# Patient Record
Sex: Female | Born: 1985 | Hispanic: Yes | Marital: Single | Smoking: Never smoker
Health system: Southern US, Community
[De-identification: ages and names within clinical notes are randomized; demographics above are authoritative.]

## PROBLEM LIST (undated history)

## (undated) DIAGNOSIS — K219 Gastro-esophageal reflux disease without esophagitis: Secondary | ICD-10-CM

## (undated) HISTORY — DX: Gastro-esophageal reflux disease without esophagitis: K21.9

---

## 2014-07-14 ENCOUNTER — Inpatient Hospital Stay (HOSPITAL_COMMUNITY)
Admission: EM | Admit: 2014-07-14 | Discharge: 2014-07-17 | DRG: 419 | Disposition: A | Discharge status: Home / Self Care | Payer: Self-pay | Attending: Surgery | Admitting: Surgery

## 2014-07-14 ENCOUNTER — Emergency Department (HOSPITAL_COMMUNITY): Payer: Self-pay

## 2014-07-14 ENCOUNTER — Encounter (HOSPITAL_COMMUNITY): Payer: Self-pay | Admitting: General Surgery

## 2014-07-14 DIAGNOSIS — K8042 Calculus of bile duct with acute cholecystitis without obstruction: Secondary | ICD-10-CM

## 2014-07-14 DIAGNOSIS — K8010 Calculus of gallbladder with chronic cholecystitis without obstruction: Secondary | ICD-10-CM | POA: Diagnosis present

## 2014-07-14 DIAGNOSIS — Z419 Encounter for procedure for purposes other than remedying health state, unspecified: Secondary | ICD-10-CM

## 2014-07-14 DIAGNOSIS — K8309 Other cholangitis: Secondary | ICD-10-CM | POA: Diagnosis present

## 2014-07-14 DIAGNOSIS — K8050 Calculus of bile duct without cholangitis or cholecystitis without obstruction: Secondary | ICD-10-CM | POA: Diagnosis present

## 2014-07-14 DIAGNOSIS — K8063 Calculus of gallbladder and bile duct with acute cholecystitis with obstruction: Principal | ICD-10-CM | POA: Diagnosis present

## 2014-07-14 LAB — COMPREHENSIVE METABOLIC PANEL
ALK PHOS: 375 U/L — AB (ref 38–126)
ALT: 706 U/L — AB (ref 14–54)
ANION GAP: 11 (ref 5–15)
AST: 430 U/L — ABNORMAL HIGH (ref 15–41)
Albumin: 4 g/dL (ref 3.5–5.0)
CO2: 27 mmol/L (ref 22–32)
Calcium: 9.3 mg/dL (ref 8.9–10.3)
Chloride: 97 mmol/L — ABNORMAL LOW (ref 101–111)
Creatinine, Ser: 0.57 mg/dL (ref 0.44–1.00)
GFR calc Af Amer: >60 mL/min (ref >60)
GFR calc non Af Amer: >60 mL/min (ref >60)
Glucose, Bld: 98 mg/dL (ref 65–99)
POTASSIUM: 3.5 mmol/L (ref 3.5–5.1)
SODIUM: 135 mmol/L (ref 135–145)
TOTAL PROTEIN: 8.6 g/dL — AB (ref 6.5–8.1)
Total Bilirubin: 8.3 mg/dL — ABNORMAL HIGH (ref 0.3–1.2)

## 2014-07-14 LAB — CBC WITH DIFFERENTIAL/PLATELET
BASOS ABS: 0 10*3/uL (ref 0.0–0.1)
BASOS PCT: 0 % (ref 0–1)
EOS ABS: 0 10*3/uL (ref 0.0–0.7)
Eosinophils Relative: 0 % (ref 0–5)
HEMATOCRIT: 36 % (ref 36.0–46.0)
HEMOGLOBIN: 12 g/dL (ref 12.0–15.0)
LYMPHS ABS: 1.3 10*3/uL (ref 0.7–4.0)
LYMPHS PCT: 13 % (ref 12–46)
MCH: 26 pg (ref 26.0–34.0)
MCHC: 33.3 g/dL (ref 30.0–36.0)
MCV: 77.9 fL — ABNORMAL LOW (ref 78.0–100.0)
Monocytes Absolute: 1.4 10*3/uL — ABNORMAL HIGH (ref 0.1–1.0)
Monocytes Relative: 13 % — ABNORMAL HIGH (ref 3–12)
NEUTROS ABS: 7.8 10*3/uL — AB (ref 1.7–7.7)
Neutrophils Relative %: 74 % (ref 43–77)
PLATELETS: 247 10*3/uL (ref 150–400)
RBC: 4.62 MIL/uL (ref 3.87–5.11)
RDW: 14.4 % (ref 11.5–15.5)
WBC: 10.5 10*3/uL (ref 4.0–10.5)

## 2014-07-14 LAB — URINALYSIS, ROUTINE W REFLEX MICROSCOPIC
Glucose, UA: NEGATIVE mg/dL
Ketones, ur: >80 mg/dL — AB
NITRITE: POSITIVE — AB
PH: 6 (ref 5.0–8.0)
PROTEIN: 30 mg/dL — AB
SPECIFIC GRAVITY, URINE: 1.025 (ref 1.005–1.030)
Urobilinogen, UA: 1 mg/dL (ref 0.0–1.0)

## 2014-07-14 LAB — URINE MICROSCOPIC-ADD ON

## 2014-07-14 LAB — PREGNANCY, URINE: Preg Test, Ur: NEGATIVE

## 2014-07-14 LAB — LIPASE, BLOOD: LIPASE: 20 U/L — AB (ref 22–51)

## 2014-07-14 MED ORDER — CEFTRIAXONE SODIUM IN DEXTROSE 40 MG/ML IV SOLN
2.0000 g | INTRAVENOUS | Status: DC
  Administered 2014-07-14 – 2014-07-16 (×3): 2 g via INTRAVENOUS
  Filled 2014-07-14 (×5): qty 50

## 2014-07-14 MED ORDER — CETYLPYRIDINIUM CHLORIDE 0.05 % MT LIQD
7.0000 mL | Freq: Two times a day (BID) | OROMUCOSAL | Status: DC
  Administered 2014-07-14 – 2014-07-15 (×3): 7 mL via OROMUCOSAL

## 2014-07-14 MED ORDER — ACETAMINOPHEN 325 MG PO TABS
650.0000 mg | ORAL_TABLET | Freq: Four times a day (QID) | ORAL | Status: DC | PRN
  Administered 2014-07-15 – 2014-07-16 (×2): 650 mg via ORAL
  Filled 2014-07-14 (×2): qty 2

## 2014-07-14 MED ORDER — ONDANSETRON HCL 4 MG/2ML IJ SOLN
4.0000 mg | Freq: Once | INTRAMUSCULAR | Status: AC
  Administered 2014-07-14: 4 mg via INTRAVENOUS
  Filled 2014-07-14: qty 2

## 2014-07-14 MED ORDER — ONDANSETRON HCL 4 MG/2ML IJ SOLN
4.0000 mg | Freq: Four times a day (QID) | INTRAMUSCULAR | Status: DC | PRN
  Administered 2014-07-15 – 2014-07-16 (×2): 4 mg via INTRAVENOUS
  Filled 2014-07-14 (×2): qty 2

## 2014-07-14 MED ORDER — DIPHENHYDRAMINE HCL 12.5 MG/5ML PO ELIX: 12.5000 mg | ORAL_SOLUTION | Freq: Four times a day (QID) | ORAL | Status: DC | PRN

## 2014-07-14 MED ORDER — ACETAMINOPHEN 650 MG RE SUPP: 650.0000 mg | Freq: Four times a day (QID) | RECTAL | Status: DC | PRN

## 2014-07-14 MED ORDER — SODIUM CHLORIDE 0.9 % IV BOLUS (SEPSIS)
500.0000 mL | Freq: Once | INTRAVENOUS | Status: AC
  Administered 2014-07-14: 500 mL via INTRAVENOUS

## 2014-07-14 MED ORDER — MORPHINE SULFATE 2 MG/ML IJ SOLN
1.0000 mg | INTRAMUSCULAR | Status: DC | PRN
  Administered 2014-07-14 – 2014-07-15 (×3): 4 mg via INTRAVENOUS
  Administered 2014-07-15: 2 mg via INTRAVENOUS
  Administered 2014-07-15: 4 mg via INTRAVENOUS
  Administered 2014-07-16 (×4): 2 mg via INTRAVENOUS
  Administered 2014-07-16: 4 mg via INTRAVENOUS
  Administered 2014-07-17: 2 mg via INTRAVENOUS
  Administered 2014-07-17 (×2): 4 mg via INTRAVENOUS
  Filled 2014-07-14 (×2): qty 1
  Filled 2014-07-14: qty 2
  Filled 2014-07-14: qty 1
  Filled 2014-07-14: qty 2
  Filled 2014-07-14: qty 1
  Filled 2014-07-14 (×4): qty 2
  Filled 2014-07-14: qty 1
  Filled 2014-07-14: qty 2
  Filled 2014-07-14: qty 1

## 2014-07-14 MED ORDER — PANTOPRAZOLE SODIUM 40 MG PO TBEC
40.0000 mg | DELAYED_RELEASE_TABLET | Freq: Every day | ORAL | Status: DC
  Administered 2014-07-14 – 2014-07-17 (×3): 40 mg via ORAL
  Filled 2014-07-14 (×3): qty 1

## 2014-07-14 MED ORDER — MORPHINE SULFATE 2 MG/ML IJ SOLN
2.0000 mg | Freq: Once | INTRAMUSCULAR | Status: AC
  Administered 2014-07-14: 2 mg via INTRAVENOUS
  Filled 2014-07-14: qty 1

## 2014-07-14 MED ORDER — KCL IN DEXTROSE-NACL 20-5-0.45 MEQ/L-%-% IV SOLN
INTRAVENOUS | Status: DC
  Administered 2014-07-14 – 2014-07-17 (×8): via INTRAVENOUS
  Filled 2014-07-14 (×9): qty 1000

## 2014-07-14 MED ORDER — CHLORHEXIDINE GLUCONATE 0.12 % MT SOLN
15.0000 mL | Freq: Two times a day (BID) | OROMUCOSAL | Status: DC
  Administered 2014-07-14 – 2014-07-17 (×3): 15 mL via OROMUCOSAL
  Filled 2014-07-14 (×4): qty 15

## 2014-07-14 MED ORDER — DIPHENHYDRAMINE HCL 50 MG/ML IJ SOLN
12.5000 mg | Freq: Four times a day (QID) | INTRAMUSCULAR | Status: DC | PRN
Start: 2014-07-14 — End: 2014-07-17

## 2014-07-14 NOTE — Progress Notes (Signed)
Pt states her pain is better now and she appears to be resting comfortably.  ERCP has been arranged for 7:30 tomorrow by Dr. Ewing SchleinMagod.  Florencia Reasonsobert V. Devora Tortorella, M.D. Pager [phone] If no answer or after 5 PM call [phone]

## 2014-07-14 NOTE — H&P (Signed)
Debra Blackburn 04-29-1985  [id number].   Primary Care MD: none Chief Complaint/Reason for Consult: choledocholithiasis HPI: This is a pleasant 29 yo Hispanic female who speaks minimal English, began having epigastric abdominal pain on Tuesday.  She has had some nausea and vomiting.  She has barely eaten since then as it makes her nausea worse.  She had diarrhea on Tuesday, but none since then.  She denies fevers.  She states yesterday her urine began to darken.  She denies skin color or icteric changes.  She presented to [facility] today for further evaluation due to pain.  She had an [place] that revealed cholelithiasis with possible wall thickening as well as a 69m CBD with a distal calculus causing an obstruction.  Her TB is 8.3 and her other LFTs are elevated as well.  Her WBC is normal with no left shift.  We have been asked to see her.  ROS : Please see HPI, otherwise negative.  She denies any history of hepatitis.  History reviewed. No pertinent family history.  History reviewed. No pertinent past medical history.  Past Surgical History  Procedure Laterality Date  . Cesarean section      x3    Social History:  reports that she has never smoked. She does not have any smokeless tobacco history on file. She reports that she does not drink alcohol or use illicit drugs.  Allergies: No Known Allergies   (Not in a hospital admission)  Blood pressure 126/74, pulse 80, temperature 98.3 F (36.8 C), temperature source Oral, resp. rate 18, weight 65.772 kg (145 lb), SpO2 100 %. Physical Exam: General: pleasant, WD, WN Hispanic female who is laying in bed in NAD HEENT: head is normocephalic, atraumatic.  Sclera are noninjected, but icteric.  PERRL.  Ears and nose without any masses or lesions.  Mouth is pink and moist Heart: regular, rate, and rhythm.  Normal s1,s2. No obvious murmurs, gallops, or rubs noted.  Palpable radial and pedal pulses bilaterally Lungs: CTAB, no wheezes, rhonchi, or  rales noted.  Respiratory effort nonlabored Abd: soft, tender in RUQ and greatest in epigastrium, ND, +BS, no masses, hernias, or organomegaly MS: all 4 extremities are symmetrical with no cyanosis, clubbing, or edema. Skin: warm and dry with no masses, lesions, or rashes, but is jaundiced Psych: A&Ox3 with an appropriate affect.    Results for orders placed or performed during the hospital encounter of 07/14/14 (from the past 48 hour(s))  Comprehensive metabolic panel     Status: Abnormal   Collection Time: 07/14/14  9:01 AM  Result Value Ref Range   Sodium 135 135 - 145 mmol/L   Potassium 3.5 3.5 - 5.1 mmol/L   Chloride 97 (L) 101 - 111 mmol/L   CO2 27 22 - 32 mmol/L   Glucose, Bld 98 65 - 99 mg/dL   BUN <5 (L) 6 - 20 mg/dL   Creatinine, Ser 0.57 0.44 - 1.00 mg/dL   Calcium 9.3 8.9 - 10.3 mg/dL   Total Protein 8.6 (H) 6.5 - 8.1 g/dL   Albumin 4.0 3.5 - 5.0 g/dL   AST 430 (H) 15 - 41 U/L   ALT 706 (H) 14 - 54 U/L   Alkaline Phosphatase 375 (H) 38 - 126 U/L   Total Bilirubin 8.3 (H) 0.3 - 1.2 mg/dL   GFR calc non Af Amer >60 >60 mL/min   GFR calc Af Amer >60 >60 mL/min    Comment: (NOTE) The eGFR has been calculated using the CKD EPI equation.  This calculation has not been validated in all clinical situations. eGFR's persistently <60 mL/min signify possible Chronic Kidney Disease.    Anion gap 11 5 - 15  Lipase, blood     Status: Abnormal   Collection Time: 07/14/14  9:01 AM  Result Value Ref Range   Lipase 20 (L) 22 - 51 U/L  CBC with Differential     Status: Abnormal   Collection Time: 07/14/14  9:01 AM  Result Value Ref Range   WBC 10.5 4.0 - 10.5 K/uL   RBC 4.62 3.87 - 5.11 MIL/uL   Hemoglobin 12.0 12.0 - 15.0 g/dL   HCT 36.0 36.0 - 46.0 %   MCV 77.9 (L) 78.0 - 100.0 fL   MCH 26.0 26.0 - 34.0 pg   MCHC 33.3 30.0 - 36.0 g/dL   RDW 14.4 11.5 - 15.5 %   Platelets 247 150 - 400 K/uL   Neutrophils Relative % 74 43 - 77 %   Neutro Abs 7.8 (H) 1.7 - 7.7 K/uL    Lymphocytes Relative 13 12 - 46 %   Lymphs Abs 1.3 0.7 - 4.0 K/uL   Monocytes Relative 13 (H) 3 - 12 %   Monocytes Absolute 1.4 (H) 0.1 - 1.0 K/uL   Eosinophils Relative 0 0 - 5 %   Eosinophils Absolute 0.0 0.0 - 0.7 K/uL   Basophils Relative 0 0 - 1 %   Basophils Absolute 0.0 0.0 - 0.1 K/uL  Urinalysis, Routine w reflex microscopic (not at [facility])     Status: Abnormal   Collection Time: 07/14/14 10:42 AM  Result Value Ref Range   Color, Urine AMBER (A) YELLOW    Comment: BIOCHEMICALS MAY BE AFFECTED BY COLOR   APPearance CLEAR CLEAR   Specific Gravity, Urine 1.025 1.005 - 1.030   pH 6.0 5.0 - 8.0   Glucose, UA NEGATIVE NEGATIVE mg/dL   Hgb urine dipstick TRACE (A) NEGATIVE   Bilirubin Urine LARGE (A) NEGATIVE   Ketones, ur >80 (A) NEGATIVE mg/dL   Protein, ur 30 (A) NEGATIVE mg/dL   Urobilinogen, UA 1.0 0.0 - 1.0 mg/dL   Nitrite POSITIVE (A) NEGATIVE   Leukocytes, UA TRACE (A) NEGATIVE  Pregnancy, urine     Status: None   Collection Time: 07/14/14 10:42 AM  Result Value Ref Range   Preg Test, Ur NEGATIVE NEGATIVE    Comment:        THE SENSITIVITY OF THIS METHODOLOGY IS >20 mIU/mL.   Urine microscopic-add on     Status: Abnormal   Collection Time: 07/14/14 10:42 AM  Result Value Ref Range   Squamous Epithelial / LPF FEW (A) RARE   WBC, UA 0-2 <3 WBC/hpf   Bacteria, UA FEW (A) RARE   Urine-Other MUCOUS PRESENT    US Abdomen Complete  07/14/2014   CLINICAL DATA:  Three-day history abdominal pain  EXAM: ULTRASOUND ABDOMEN COMPLETE  COMPARISON:  None.  FINDINGS: Gallbladder: Gallbladder is filled with calculi. The gallbladder wall appears thickened and somewhat contracted. There is no pericholecystic fluid. Patient is focally tender over the gallbladder.  Common bile duct: Diameter: 9 mm. There is a calculus in the distal common bile duct causing obstruction.  Liver: No focal lesion identified. Note that there is intrahepatic biliary duct dilatation. Within normal limits in  parenchymal echogenicity.  IVC: No abnormality visualized.  Pancreas: Visualized portion unremarkable. Portions of the tail of the pancreas are obscured by gas.  Spleen: Size and appearance within normal limits.  Right Kidney: Length:  10.9 cm. Echogenicity within normal limits. No mass or hydronephrosis visualized.  Left Kidney: Length: 11.9 cm. Echogenicity within normal limits. No mass or hydronephrosis visualized.  Abdominal aorta: No aneurysm visualized.  Other findings: No demonstrable ascites.  IMPRESSION: Gallbladder filled with calculi. Gallbladder wall is thickened. The patient is focally tender over the gallbladder. These are findings consistent with acute cholecystitis. There is also a calculus in the distal common bile duct causing biliary duct obstruction. There is intrahepatic and extrahepatic biliary duct dilatation.  Portions of the tail of the pancreas are obscured by gas. Visualized portions of pancreas appear normal.  Study otherwise unremarkable.   Electronically Signed   By: Lowella Grip III M.D.   On: 07/14/2014 10:19       Assessment/Plan 1. Choledocholithiasis with elevated TB at 8.3. -admit, IVFs, rocephin -NPO -GI consult for ERCP -will plan on lap chole after ERCP -repeat labs in am -SCDs.  Hold chemical prophylaxis right now due to pending procedures -this was all d/w patient via an interpreter.  She understands and is agreeable to proceed.  Adelis Docter E 07/14/2014, 11:39 AM Pager: [phone]

## 2014-07-14 NOTE — ED Notes (Signed)
Belly pain since Tuesday; vomiting after eating; 4 episodes of diarr.; constant. Pt reports she is not pregnant. Pt reports this has never happened to her before.

## 2014-07-14 NOTE — ED Provider Notes (Signed)
CSN: [id number]     Arrival date & time 07/14/14  0820 History   First MD Initiated Contact with Patient 07/14/14 0827     Chief Complaint  Patient presents with  . Abdominal Pain    Patient speaks Spanish and was translated using the translator phones (Consider location/radiation/quality/duration/timing/severity/associated sxs/prior Treatment) Patient is a 29 y.o. female presenting with abdominal pain. The history is provided by the patient.  Abdominal Pain Associated symptoms: diarrhea, nausea and vomiting   Associated symptoms: no chest pain and no shortness of breath    patient resents with abdominal pain has been going for the last 5 days. It is in the upper abdomen on the right side worse with eating. No fevers. Has had a little diarrhea. Decreased appetite. The pain is dull. It does radiate to the back. It started in this location and is not changed. She denies dysuria. She denies possibility of pregnancy.  No past medical history on file. No past surgical history on file. No family history on file. History  Substance Use Topics  . Smoking status: Not on file  . Smokeless tobacco: Not on file  . Alcohol Use: Not on file   OB History    No data available     Review of Systems  Constitutional: Positive for appetite change. Negative for activity change.  Eyes: Negative for pain.  Respiratory: Negative for chest tightness and shortness of breath.   Cardiovascular: Negative for chest pain and leg swelling.  Gastrointestinal: Positive for nausea, vomiting, abdominal pain and diarrhea.  Genitourinary: Negative for flank pain.  Musculoskeletal: Positive for back pain. Negative for neck stiffness.  Skin: Negative for rash.  Neurological: Negative for weakness, numbness and headaches.      Allergies  Review of patient's allergies indicates no known allergies.  Home Medications   Prior to Admission medications   Not on File   BP 126/74 mmHg  Pulse 80  Temp(Src) 98.3  F (36.8 C) (Oral)  Resp 18  Wt 145 lb (65.772 kg)  SpO2 100% Physical Exam  Constitutional: She appears well-developed and well-nourished.  HENT:  Head: Normocephalic.  Eyes: Pupils are equal, round, and reactive to light.  Cardiovascular: Normal rate.   Pulmonary/Chest: Effort normal.  Abdominal: There is tenderness. There is no rebound and no guarding.  Right upper quadrant tenderness. No masses. No hernias.  Genitourinary:  No CVA tenderness  Musculoskeletal: Normal range of motion.  Neurological: She is alert.    ED Course  Procedures (including critical care time) Labs Review Labs Reviewed  COMPREHENSIVE METABOLIC PANEL - Abnormal; Notable for the following:    Chloride 97 (*)    BUN <5 (*)    Total Protein 8.6 (*)    AST 430 (*)    ALT 706 (*)    Alkaline Phosphatase 375 (*)    Total Bilirubin 8.3 (*)    All other components within normal limits  LIPASE, BLOOD - Abnormal; Notable for the following:    Lipase 20 (*)    All other components within normal limits  CBC WITH DIFFERENTIAL/PLATELET - Abnormal; Notable for the following:    MCV 77.9 (*)    Neutro Abs 7.8 (*)    Monocytes Relative 13 (*)    Monocytes Absolute 1.4 (*)    All other components within normal limits  PREGNANCY, URINE  URINALYSIS, ROUTINE W REFLEX MICROSCOPIC (NOT AT [facility])    Imaging Review US Abdomen Complete  07/14/2014   CLINICAL DATA:  Three-day history abdominal  pain  EXAM: ULTRASOUND ABDOMEN COMPLETE  COMPARISON:  None.  FINDINGS: Gallbladder: Gallbladder is filled with calculi. The gallbladder wall appears thickened and somewhat contracted. There is no pericholecystic fluid. Patient is focally tender over the gallbladder.  Common bile duct: Diameter: 9 mm. There is a calculus in the distal common bile duct causing obstruction.  Liver: No focal lesion identified. Note that there is intrahepatic biliary duct dilatation. Within normal limits in parenchymal echogenicity.  IVC: No abnormality  visualized.  Pancreas: Visualized portion unremarkable. Portions of the tail of the pancreas are obscured by gas.  Spleen: Size and appearance within normal limits.  Right Kidney: Length: 10.9 cm. Echogenicity within normal limits. No mass or hydronephrosis visualized.  Left Kidney: Length: 11.9 cm. Echogenicity within normal limits. No mass or hydronephrosis visualized.  Abdominal aorta: No aneurysm visualized.  Other findings: No demonstrable ascites.  IMPRESSION: Gallbladder filled with calculi. Gallbladder wall is thickened. The patient is focally tender over the gallbladder. These are findings consistent with acute cholecystitis. There is also a calculus in the distal common bile duct causing biliary duct obstruction. There is intrahepatic and extrahepatic biliary duct dilatation.  Portions of the tail of the pancreas are obscured by gas. Visualized portions of pancreas appear normal.  Study otherwise unremarkable.   Electronically Signed   By: Bretta BangWilliam  Woodruff III M.D.   On: 07/14/2014 10:19     EKG Interpretation None      MDM   Final diagnoses:  Choledocholithiasis with acute cholecystitis    Patient with abdominal pain. Has choledocholithiasis with cholecystitis. Are not heard about to be admitted by general surgery.    Benjiman CoreNathan Masayo Fera, MD 07/14/14 [phone]

## 2014-07-14 NOTE — Consult Note (Signed)
Referring Provider: [facility] surgery Primary Care Physician:  No primary care provider on file. Primary Gastroenterologist:  None (unassigned)  Reason for Consultation:  Common bile duct stone  HPI: Debra Blackburn is a 29 y.o. female being admitted through the emergency room today with a 2 day history of significant abdominal pain, nausea, and vomiting with significant elevation of liver chemistries (bilirubin 8, transaminases in the 500 range) with documented choledocholithiasis and cholelithiasis on abdominal ultrasound, with a 9 mm CBD.   No fevers or chills.   She is otherwise in good health.   At this time, she remains in pain, which has been continuously present for the past 2 days. However, I did order 2 mg of morphine in the emergency room which was administered and she is now somewhat better and appears somewhat more comfortable.   The patient just flew up from [place] about a week or 2 ago. She speaks very limited [place]. She is joining her mother, sisters, and her boyfriend is coming.   History reviewed. No pertinent past medical history.  Past Surgical History  Procedure Laterality Date  . Cesarean section      x3    Prior to Admission medications   Not on File    No current facility-administered medications for this encounter.   No current outpatient prescriptions on file.    Allergies as of 07/14/2014  . (No Known Allergies)    History reviewed. No pertinent family history.  History   Social History  . Marital Status: Single    Spouse Name: N/A  . Number of Children: N/A  . Years of Education: N/A   Occupational History  . Not on file.   Social History Main Topics  . Smoking status: Never Smoker   . Smokeless tobacco: Not on file  . Alcohol Use: No  . Drug Use: No  . Sexual Activity: Not on file   Other Topics Concern  . Not on file   Social History Narrative  . No narrative on file    Review of Systems: See history of  present illness. She has had brown urine  Physical Exam: Vital signs in last 24 hours: Temp:  [98.3 F (36.8 C)] 98.3 F (36.8 C) (06/30 0832) Pulse Rate:  [75-102] 92 (06/30 1330) Resp:  [16-18] 18 (06/30 0900) BP: (112-140)/(57-82) 140/82 mmHg (06/30 1330) SpO2:  [97 %-100 %] 100 % (06/30 1330) Weight:  [65.772 kg (145 lb)] 65.772 kg (145 lb) (06/30 0833)   General:   Alert, overall healthy in appearance, Well-developed, well-nourished, pleasant and cooperative in mild to moderate distress because of abdominal pain. Head:  Normocephalic and atraumatic. Eyes:  Sclera anicteric.  Conjunctiva pink. Mouth:   No ulcerations or lesions.  Oropharynx pink & moist. Neck:   No masses or thyromegaly. Lungs:  Clear throughout to auscultation.   No wheezes, crackles, or rhonchi. No evident respiratory distress. Heart:   Regular rate and rhythm; no murmurs, clicks, rubs,  or gallops. Abdomen:  Soft, nontympanitic, and nondistended. No masses, hepatosplenomegaly or ventral hernias noted. Normal bowel sounds, without bruits.. There is mild to moderate right upper quadrant tenderness. Msk:   Symmetrical without gross deformities. Pulses:  Normal radial pulse is noted. Extremities:   Without clubbing, cyanosis, or edema. Neurologic:  Alert and coherent;  grossly normal neurologically. Skin:  Intact without significant lesions or rashes. Cervical Nodes:  No significant cervical adenopathy. Psych:   Alert and cooperative. Normal mood and affect.  Intake/Output from previous day:  Intake/Output this shift: Total I/O In: 500 [I.V.:500] Out: -   Lab Results:  Recent Labs  07/14/14 0901  WBC 10.5  HGB 12.0  HCT 36.0  PLT 247   BMET  Recent Labs  07/14/14 0901  NA 135  K 3.5  CL 97*  CO2 27  GLUCOSE 98  BUN <5*  CREATININE 0.57  CALCIUM 9.3   LFT  Recent Labs  07/14/14 0901  PROT 8.6*  ALBUMIN 4.0  AST 430*  ALT 706*  ALKPHOS 375*  BILITOT 8.3*   PT/INR No results  for input(s): LABPROT, INR in the last 72 hours.  Studies/Results: [place] Abdomen Complete  07/14/2014   CLINICAL DATA:  Three-day history abdominal pain  EXAM: ULTRASOUND ABDOMEN COMPLETE  COMPARISON:  None.  FINDINGS: Gallbladder: Gallbladder is filled with calculi. The gallbladder wall appears thickened and somewhat contracted. There is no pericholecystic fluid. Patient is focally tender over the gallbladder.  Common bile duct: Diameter: 9 mm. There is a calculus in the distal common bile duct causing obstruction.  Liver: No focal lesion identified. Note that there is intrahepatic biliary duct dilatation. Within normal limits in parenchymal echogenicity.  IVC: No abnormality visualized.  Pancreas: Visualized portion unremarkable. Portions of the tail of the pancreas are obscured by gas.  Spleen: Size and appearance within normal limits.  Right Kidney: Length: 10.9 cm. Echogenicity within normal limits. No mass or hydronephrosis visualized.  Left Kidney: Length: 11.9 cm. Echogenicity within normal limits. No mass or hydronephrosis visualized.  Abdominal aorta: No aneurysm visualized.  Other findings: No demonstrable ascites.  IMPRESSION: Gallbladder filled with calculi. Gallbladder wall is thickened. The patient is focally tender over the gallbladder. These are findings consistent with acute cholecystitis. There is also a calculus in the distal common bile duct causing biliary duct obstruction. There is intrahepatic and extrahepatic biliary duct dilatation.  Portions of the tail of the pancreas are obscured by gas. Visualized portions of pancreas appear normal.  Study otherwise unremarkable.   Electronically Signed   By: Bretta BangWilliam  Woodruff III M.D.   On: 07/14/2014 10:19    Impression:  1. Choledocholithiasis with significant elevation of liver chemistries but no fever, chills, or leukocytosis to suggest current septic cholangitis.  2. Cholelithiasis  Plan: I agree with the surgeons that preoperative ERCP  is indicated. The patient does not appear to have any complicating factors or contraindications to the procedure. Her pregnancy test is negative, and she is in good overall medical health.  I have discussed the nature, purpose, and risks of that procedure along with sphincterotomy and stone extraction with the assistance of a telephone medical interpreter. Specific risks discussed included a roughly 3% risk of pancreatitis which can sometimes be severe and life threatening, anesthesia risks, infection, bleeding, and perforation with possible need for corrective surgery.  The patient's questions were answered. She seems to have a very good understanding and is agreeable to proceed. In fact, because of her pain, she would like it done as soon as possible.  I have been in contact with the covering physician at my office who does ERCP procedures and we are in the process of arranging the logistics. At this time, although it is possible it will be done this evening (the patient has not had any food today), more likely it will be done early tomorrow morning.   I agree with antibiotic coverage as already ordered while awaiting ERCP, to help prevent cholangitis from developing.   LOS: 0 days   Florencia ReasonsBUCCINI,Harlym Gehling V  07/14/2014, 2:03 PM   Pager [phone] If no answer or after 5 PM call [phone]

## 2014-07-15 ENCOUNTER — Inpatient Hospital Stay (HOSPITAL_COMMUNITY): Payer: MEDICAID | Admitting: Certified Registered Nurse Anesthetist

## 2014-07-15 ENCOUNTER — Encounter (HOSPITAL_COMMUNITY): Payer: Self-pay | Admitting: *Deleted

## 2014-07-15 ENCOUNTER — Inpatient Hospital Stay (HOSPITAL_COMMUNITY): Payer: Self-pay | Admitting: Certified Registered Nurse Anesthetist

## 2014-07-15 ENCOUNTER — Inpatient Hospital Stay (HOSPITAL_COMMUNITY): Payer: Self-pay

## 2014-07-15 ENCOUNTER — Encounter (HOSPITAL_COMMUNITY): Admission: EM | Disposition: A | Discharge status: Home / Self Care | Payer: Self-pay | Source: Home / Self Care

## 2014-07-15 HISTORY — PX: ENDOSCOPIC RETROGRADE CHOLANGIOPANCREATOGRAPHY (ERCP) WITH PROPOFOL: SHX5810

## 2014-07-15 LAB — COMPREHENSIVE METABOLIC PANEL
ALT: 466 U/L — AB (ref 14–54)
ANION GAP: 9 (ref 5–15)
AST: 207 U/L — ABNORMAL HIGH (ref 15–41)
Albumin: 3.2 g/dL — ABNORMAL LOW (ref 3.5–5.0)
Alkaline Phosphatase: 294 U/L — ABNORMAL HIGH (ref 38–126)
BILIRUBIN TOTAL: 7 mg/dL — AB (ref 0.3–1.2)
CALCIUM: 8.7 mg/dL — AB (ref 8.9–10.3)
CO2: 26 mmol/L (ref 22–32)
Chloride: 100 mmol/L — ABNORMAL LOW (ref 101–111)
Creatinine, Ser: 0.35 mg/dL — ABNORMAL LOW (ref 0.44–1.00)
GFR calc Af Amer: >60 mL/min (ref >60)
GFR calc non Af Amer: >60 mL/min (ref >60)
GLUCOSE: 111 mg/dL — AB (ref 65–99)
Potassium: 3.8 mmol/L (ref 3.5–5.1)
Sodium: 135 mmol/L (ref 135–145)
TOTAL PROTEIN: 7.3 g/dL (ref 6.5–8.1)

## 2014-07-15 LAB — ABO/RH: ABO/RH(D): O POS

## 2014-07-15 LAB — TYPE AND SCREEN
ABO/RH(D): O POS
ANTIBODY SCREEN: NEGATIVE

## 2014-07-15 LAB — CBC
HCT: 33.1 % — ABNORMAL LOW (ref 36.0–46.0)
Hemoglobin: 10.7 g/dL — ABNORMAL LOW (ref 12.0–15.0)
MCH: 25.1 pg — AB (ref 26.0–34.0)
MCHC: 32.3 g/dL (ref 30.0–36.0)
MCV: 77.5 fL — ABNORMAL LOW (ref 78.0–100.0)
Platelets: 214 10*3/uL (ref 150–400)
RBC: 4.27 MIL/uL (ref 3.87–5.11)
RDW: 14.4 % (ref 11.5–15.5)
WBC: 8.2 10*3/uL (ref 4.0–10.5)

## 2014-07-15 SURGERY — ENDOSCOPIC RETROGRADE CHOLANGIOPANCREATOGRAPHY (ERCP) WITH PROPOFOL
Anesthesia: General

## 2014-07-15 MED ORDER — LACTATED RINGERS IV SOLN
INTRAVENOUS | Status: DC | PRN
  Administered 2014-07-15 (×2): via INTRAVENOUS

## 2014-07-15 MED ORDER — MENTHOL 3 MG MT LOZG
1.0000 | LOZENGE | OROMUCOSAL | Status: DC | PRN
  Administered 2014-07-15: 3 mg via ORAL
  Filled 2014-07-15: qty 9

## 2014-07-15 MED ORDER — SUCCINYLCHOLINE CHLORIDE 20 MG/ML IJ SOLN
INTRAMUSCULAR | Status: DC | PRN
  Administered 2014-07-15: 100 mg via INTRAVENOUS

## 2014-07-15 MED ORDER — SODIUM CHLORIDE 0.9 % IV SOLN: INTRAVENOUS | Status: DC

## 2014-07-15 MED ORDER — ONDANSETRON HCL 4 MG/2ML IJ SOLN
INTRAMUSCULAR | Status: DC | PRN
  Administered 2014-07-15: 4 mg via INTRAVENOUS

## 2014-07-15 MED ORDER — PROPOFOL 10 MG/ML IV BOLUS
INTRAVENOUS | Status: DC | PRN
  Administered 2014-07-15: 160 mg via INTRAVENOUS

## 2014-07-15 MED ORDER — GLUCAGON HCL RDNA (DIAGNOSTIC) 1 MG IJ SOLR
INTRAMUSCULAR | Status: AC
  Filled 2014-07-15: qty 1

## 2014-07-15 MED ORDER — PHENYLEPHRINE HCL 10 MG/ML IJ SOLN
10.0000 mg | INTRAVENOUS | Status: DC | PRN
  Administered 2014-07-15: 10 ug/min via INTRAVENOUS

## 2014-07-15 MED ORDER — FENTANYL CITRATE (PF) 100 MCG/2ML IJ SOLN
INTRAMUSCULAR | Status: DC | PRN
  Administered 2014-07-15: 50 ug via INTRAVENOUS
  Administered 2014-07-15 (×2): 25 ug via INTRAVENOUS

## 2014-07-15 MED ORDER — LACTATED RINGERS IV SOLN
INTRAVENOUS | Status: DC
  Administered 2014-07-15: 07:00:00 via INTRAVENOUS

## 2014-07-15 MED ORDER — PHENYLEPHRINE HCL 10 MG/ML IJ SOLN
INTRAMUSCULAR | Status: DC | PRN
  Administered 2014-07-15 (×2): 40 ug via INTRAVENOUS

## 2014-07-15 MED ORDER — LIDOCAINE HCL (CARDIAC) 20 MG/ML IV SOLN
INTRAVENOUS | Status: DC | PRN
  Administered 2014-07-15: 70 mg via INTRAVENOUS

## 2014-07-15 NOTE — Transfer of Care (Signed)
Immediate Anesthesia Transfer of Care Note  Patient: Debra Blackburn  Procedure(s) Performed: Procedure(s): ENDOSCOPIC RETROGRADE CHOLANGIOPANCREATOGRAPHY (ERCP) WITH PROPOFOL (N/A)  Patient Location: PACU  Anesthesia Type:General  Level of Consciousness: sedated  Airway & Oxygen Therapy: Patient Spontanous Breathing  Post-op Assessment: Report given to RN and Post -op Vital signs reviewed and stable  Post vital signs: Reviewed and stable  Last Vitals:  Filed Vitals:   07/15/14 0659  BP: 116/82  Pulse: 87  Temp: 37.1 C  Resp: 9    Complications: No apparent anesthesia complications

## 2014-07-15 NOTE — Progress Notes (Signed)
UR completed.    Brittainy Bucker W. Diala Waxman, RN, BSN  Trauma/Neuro ICU Case Manager [phone] 

## 2014-07-15 NOTE — Anesthesia Preprocedure Evaluation (Addendum)
Anesthesia Evaluation  Patient identified by MRN, date of birth, ID band Patient awake    Reviewed: Allergy & Precautions, NPO status , Patient's Chart, lab work & pertinent test results  Airway Mallampati: II  TM Distance: >3 FB Neck ROM: Full    Dental   Pulmonary neg pulmonary ROS,  breath sounds clear to auscultation        Cardiovascular negative cardio ROS  Rhythm:Regular Rate:Normal     Neuro/Psych    GI/Hepatic negative GI ROS, Neg liver ROS,   Endo/Other    Renal/GU negative Renal ROS     Musculoskeletal   Abdominal   Peds  Hematology   Anesthesia Other Findings   Reproductive/Obstetrics                             Anesthesia Physical Anesthesia Plan  ASA: II  Anesthesia Plan: General   Post-op Pain Management:    Induction: Intravenous  Airway Management Planned: Oral ETT  Additional Equipment:   Intra-op Plan:   Post-operative Plan: Extubation in OR  Informed Consent: I have reviewed the patients History and Physical, chart, labs and discussed the procedure including the risks, benefits and alternatives for the proposed anesthesia with the patient or authorized representative who has indicated his/her understanding and acceptance.   Dental advisory given  Plan Discussed with: CRNA and Anesthesiologist  Anesthesia Plan Comments:         Anesthesia Quick Evaluation

## 2014-07-15 NOTE — Progress Notes (Signed)
Patient ID: Debra Blackburn, female   DOB: 17-May-1985, 29 y.o.   MRN: [id number]   LOS: 1 day   Subjective: Doing well s/p ERCP, denies pain.   Objective: Vital signs in last 24 hours: Temp:  [97.9 F (36.6 C)-100.1 F (37.8 C)] 97.9 F (36.6 C) (07/01 0911) Pulse Rate:  [73-102] 73 (07/01 0930) Resp:  [9-21] 17 (07/01 0930) BP: (109-142)/(53-82) 111/62 mmHg (07/01 0930) SpO2:  [97 %-100 %] 98 % (07/01 0930)    Laboratory  CBC  Recent Labs  07/14/14 0901 07/15/14 0340  WBC 10.5 8.2  HGB 12.0 10.7*  HCT 36.0 33.1*  PLT 247 214   BMET  Recent Labs  07/14/14 0901 07/15/14 0340  NA 135 135  K 3.5 3.8  CL 97* 100*  CO2 27 26  GLUCOSE 98 111*  BUN <5* <5*  CREATININE 0.57 0.35*  CALCIUM 9.3 8.7*   Hepatic Function Latest Ref Rng 07/15/2014 07/14/2014  Total Protein 6.5 - 8.1 g/dL 7.3 8.6(H)  Albumin 3.5 - 5.0 g/dL 3.2(L) 4.0  AST 15 - 41 U/L 207(H) 430(H)  ALT 14 - 54 U/L 466(H) 706(H)  Alk Phosphatase 38 - 126 U/L 294(H) 375(H)  Total Bilirubin 0.3 - 1.2 mg/dL 7.0(H) 8.3(H)    Physical Exam General appearance: alert and no distress Resp: clear to auscultation bilaterally Cardio: regular rate and rhythm GI: normal findings: bowel sounds normal and soft, non-tender   Assessment/Plan: Choledocholithiasis with elevated TB at 8.3 s/p ERCP -- Plan lap chole in am if labs ok FEN -- Clears today, NPO after MN VTE -- SCD's Dispo -- OR tomorrow    Lisette Abu, PA-C Pager: [phone] 07/15/2014

## 2014-07-15 NOTE — Progress Notes (Signed)
Resting comfortably post ERCP, no pain. Abd nontender.  Florencia Reasonsobert V. Osric Klopf, M.D. Pager [phone] If no answer or after 5 PM call [phone]

## 2014-07-15 NOTE — Op Note (Signed)
[facility] [address] [place], [id number]   ERCP PROCEDURE REPORT  PATIENT: Debra Blackburn, Debra Blackburn  MR# :[id number] BIRTHDATE: [birth year]  GENDER: female ENDOSCOPIST: Vida RiggerMarc Thamara Leger, MD REFERRED BY: PROCEDURE DATE:  07/15/2014 PROCEDURE:   ERCP with sphincterotomy/papillotomy, ERCP with Pancreatic duct stent placement and removal at the end of the procedure, ERCP with destruction of calculus/calculi, and ERCP with removal of calculus/calculi ASA CLASS:    1 INDICATIONS: CBD stone on ultrasound MEDICATIONS:    general anesthesia TOPICAL ANESTHETIC:  none  DESCRIPTION OF PROCEDURE:   After the risks benefits and alternatives of the procedure were thoroughly explained, informed consent was obtained.  The Pentax ERCP [id number]  endoscope was introduced through the mouth and advanced to the second portion of the duodenum .a normal-appearing ampulla was brought into view and using the triple lumen sphincterotome loaded with the JAG Jagwire deep selective cannulation of the pancreas was obtained on the first 3 attempts without any injections but the wire advancing towards the pancreas on all 3 so we elected to place a 5 French 3 cm single pigtail stent in the customary fashion and then we proceeded with deep selective CBD cannulation using the sphincterotome and wireany small CBD stone was seen and there was some cystic duct filling but no gallbladder filling and we proceeded with a moderately large sphincterotomy in the customary fashion and then tried a balloon pull-through but there was significant resistance at the stone and the balloon popped and we then switched to the 2 cm basket and the stone was grabbed but could not be removed so we crushed it and then proceeded with multiple balloon pull-through's and multiple fragments were removed and there was some white mild cholangitis looking material removed and we then proceeded with a negative occlusion  cholangiogram after multiple pull-through's and there did not seem to be any residual stones and we elected to remove the pancreatic duct stent which was grabbed with the biopsy forceps and was pulled into the scope but it was not recovered when we removed the biopsy forceps and was not seen in the duodenum or stomach nor on fluoroscopy and when the scope was removed we passed the basket through the channel but was still not recovered and the patient tolerated the procedure well there was no obvious immediate complication other than not initially finding the stent although they will relook for it in the cleaning roomEstimated blood loss is zero unless otherwise noted in this procedure report.       COMPLICATIONS: stent not recovered as above ENDOSCOPIC IMPRESSION:1. Normal ampulla 2. 3 pancreatic duct wire advancements status post stent placement and removal as above but stent not recovered at the time of dictation 3. Small CBD stone removed with basket and crushing and multiple balloon pull-through use as above 4. Mild cholangitis white material seen 5. Negative occlusion cholangiogram at the end of the procedure with adequate biliary drainage 6. Some cystic duct filling without gallbladder filling  RECOMMENDATIONS:customary post-ERCP orders otherwise if no delayed complication consider laparoscopic cholecystectomy later today or tomorrow and will follow with you and follow liver tests back to normal probably as an outpatient and repeat ERCP if Intra-Op cholangiogram shows any residual stones     _______________________________ eSigned:  Vida RiggerMarc Mann Skaggs, MD 07/15/2014 9:10 AM   CC:  PATIENT NAME:  Debra Blackburn, Debra Blackburn MR#: [id number]

## 2014-07-15 NOTE — Progress Notes (Signed)
Debra Blackburn 7:41 AM  Subjective: Patient actually feeling better and her case was discussed with my partner and her hospital computer chart reviewed and case briefly discussed with her and her husband and no new complaints  Objective: Vital signs stable afebrile no acute distress exam please see preassessment evaluation liver tests slight decrease CBC normal ultrasound reviewed Assessment: CBD stones  Plan: Okay to proceed with ERCP which was briefly rediscussed but thoroughly discussed with my partner yesterday Dr. Robbi GarterB  Debra Blackburn E  Pager [phone] After 5PM or if no answer call [phone]

## 2014-07-15 NOTE — Anesthesia Procedure Notes (Signed)
Procedure Name: Intubation Date/Time: 07/15/2014 7:59 AM Performed by: Merdis Delay Pre-anesthesia Checklist: Patient identified, Emergency Drugs available, Suction available, Patient being monitored and Timeout performed Patient Re-evaluated:Patient Re-evaluated prior to inductionOxygen Delivery Method: Circle system utilized Preoxygenation: Pre-oxygenation with 100% oxygen Intubation Type: IV induction Laryngoscope Size: Mac Grade View: Grade I Tube size: 7.0 mm Number of attempts: 1 Airway Equipment and Method: Stylet and LTA kit utilized Placement Confirmation: ETT inserted through vocal cords under direct vision,  positive ETCO2,  CO2 detector and breath sounds checked- equal and bilateral Secured at: 22 cm Tube secured with: Tape Dental Injury: Teeth and Oropharynx as per pre-operative assessment

## 2014-07-15 NOTE — Procedures (Addendum)
As an addendum the stent was found inside the scope on the cleaning process

## 2014-07-15 NOTE — Anesthesia Postprocedure Evaluation (Signed)
  Anesthesia Post-op Note  Patient: Debra Blackburn  Procedure(s) Performed: Procedure(s): ENDOSCOPIC RETROGRADE CHOLANGIOPANCREATOGRAPHY (ERCP) WITH PROPOFOL (N/A)  Patient Location: PACU  Anesthesia Type:General  Level of Consciousness: awake  Airway and Oxygen Therapy: Patient Spontanous Breathing  Post-op Pain: mild  Post-op Assessment: Post-op Vital signs reviewed              Post-op Vital Signs: Reviewed  Last Vitals:  Filed Vitals:   07/15/14 0930  BP: 111/62  Pulse: 73  Temp:   Resp: 17    Complications: No apparent anesthesia complications

## 2014-07-16 ENCOUNTER — Encounter (HOSPITAL_COMMUNITY): Admission: EM | Disposition: A | Discharge status: Home / Self Care | Payer: Self-pay | Source: Home / Self Care

## 2014-07-16 ENCOUNTER — Inpatient Hospital Stay (HOSPITAL_COMMUNITY): Payer: MEDICAID | Admitting: Certified Registered Nurse Anesthetist

## 2014-07-16 ENCOUNTER — Inpatient Hospital Stay (HOSPITAL_COMMUNITY): Payer: Self-pay | Admitting: Certified Registered Nurse Anesthetist

## 2014-07-16 ENCOUNTER — Inpatient Hospital Stay (HOSPITAL_COMMUNITY): Payer: Self-pay

## 2014-07-16 HISTORY — PX: CHOLECYSTECTOMY: SHX55

## 2014-07-16 LAB — CBC WITH DIFFERENTIAL/PLATELET
BASOS ABS: 0 10*3/uL (ref 0.0–0.1)
Basophils Relative: 0 % (ref 0–1)
EOS ABS: 0 10*3/uL (ref 0.0–0.7)
EOS PCT: 0 % (ref 0–5)
HCT: 33.5 % — ABNORMAL LOW (ref 36.0–46.0)
Hemoglobin: 10.7 g/dL — ABNORMAL LOW (ref 12.0–15.0)
LYMPHS ABS: 1.1 10*3/uL (ref 0.7–4.0)
LYMPHS PCT: 14 % (ref 12–46)
MCH: 24.9 pg — ABNORMAL LOW (ref 26.0–34.0)
MCHC: 31.9 g/dL (ref 30.0–36.0)
MCV: 78.1 fL (ref 78.0–100.0)
MONO ABS: 0.7 10*3/uL (ref 0.1–1.0)
MONOS PCT: 9 % (ref 3–12)
NEUTROS ABS: 5.7 10*3/uL (ref 1.7–7.7)
Neutrophils Relative %: 77 % (ref 43–77)
Platelets: 235 10*3/uL (ref 150–400)
RBC: 4.29 MIL/uL (ref 3.87–5.11)
RDW: 14.5 % (ref 11.5–15.5)
WBC: 7.5 10*3/uL (ref 4.0–10.5)

## 2014-07-16 LAB — COMPREHENSIVE METABOLIC PANEL
ALK PHOS: 274 U/L — AB (ref 38–126)
ALT: 329 U/L — ABNORMAL HIGH (ref 14–54)
ANION GAP: 7 (ref 5–15)
AST: 146 U/L — AB (ref 15–41)
Albumin: 3.1 g/dL — ABNORMAL LOW (ref 3.5–5.0)
BUN: <5 mg/dL — ABNORMAL LOW (ref 6–20)
CO2: 26 mmol/L (ref 22–32)
Calcium: 8.4 mg/dL — ABNORMAL LOW (ref 8.9–10.3)
Chloride: 103 mmol/L (ref 101–111)
Creatinine, Ser: 0.44 mg/dL (ref 0.44–1.00)
GFR calc Af Amer: >60 mL/min (ref >60)
GLUCOSE: 111 mg/dL — AB (ref 65–99)
Potassium: 3.7 mmol/L (ref 3.5–5.1)
SODIUM: 136 mmol/L (ref 135–145)
TOTAL PROTEIN: 7.2 g/dL (ref 6.5–8.1)
Total Bilirubin: 2.9 mg/dL — ABNORMAL HIGH (ref 0.3–1.2)

## 2014-07-16 LAB — SURGICAL PCR SCREEN
MRSA, PCR: NEGATIVE
Staphylococcus aureus: NEGATIVE

## 2014-07-16 SURGERY — LAPAROSCOPIC CHOLECYSTECTOMY WITH INTRAOPERATIVE CHOLANGIOGRAM
Anesthesia: General | Site: Abdomen

## 2014-07-16 MED ORDER — ROCURONIUM BROMIDE 100 MG/10ML IV SOLN
INTRAVENOUS | Status: DC | PRN
  Administered 2014-07-16: 10 mg via INTRAVENOUS
  Administered 2014-07-16: 40 mg via INTRAVENOUS

## 2014-07-16 MED ORDER — FENTANYL CITRATE (PF) 100 MCG/2ML IJ SOLN
INTRAMUSCULAR | Status: AC
  Filled 2014-07-16: qty 2

## 2014-07-16 MED ORDER — FENTANYL CITRATE (PF) 100 MCG/2ML IJ SOLN
INTRAMUSCULAR | Status: DC | PRN
  Administered 2014-07-16 (×2): 50 ug via INTRAVENOUS
  Administered 2014-07-16: 100 ug via INTRAVENOUS
  Administered 2014-07-16 (×3): 50 ug via INTRAVENOUS

## 2014-07-16 MED ORDER — PHENYLEPHRINE HCL 10 MG/ML IJ SOLN
INTRAMUSCULAR | Status: DC | PRN
  Administered 2014-07-16: 40 ug via INTRAVENOUS
  Administered 2014-07-16: 80 ug via INTRAVENOUS

## 2014-07-16 MED ORDER — FENTANYL CITRATE (PF) 250 MCG/5ML IJ SOLN
INTRAMUSCULAR | Status: AC
  Filled 2014-07-16: qty 5

## 2014-07-16 MED ORDER — 0.9 % SODIUM CHLORIDE (POUR BTL) OPTIME
TOPICAL | Status: DC | PRN
  Administered 2014-07-16: 1000 mL

## 2014-07-16 MED ORDER — ROCURONIUM BROMIDE 50 MG/5ML IV SOLN
INTRAVENOUS | Status: AC
  Filled 2014-07-16: qty 2

## 2014-07-16 MED ORDER — LIDOCAINE HCL (CARDIAC) 20 MG/ML IV SOLN
INTRAVENOUS | Status: DC | PRN
  Administered 2014-07-16: 40 mg via INTRAVENOUS

## 2014-07-16 MED ORDER — NEOSTIGMINE METHYLSULFATE 10 MG/10ML IV SOLN
INTRAVENOUS | Status: AC
  Filled 2014-07-16: qty 2

## 2014-07-16 MED ORDER — NEOSTIGMINE METHYLSULFATE 10 MG/10ML IV SOLN
INTRAVENOUS | Status: DC | PRN
  Administered 2014-07-16: 3 mg via INTRAVENOUS

## 2014-07-16 MED ORDER — ONDANSETRON HCL 4 MG/2ML IJ SOLN
INTRAMUSCULAR | Status: DC | PRN
  Administered 2014-07-16 (×2): 4 mg via INTRAVENOUS

## 2014-07-16 MED ORDER — PROPOFOL 10 MG/ML IV BOLUS
INTRAVENOUS | Status: DC | PRN
  Administered 2014-07-16: 20 mg via INTRAVENOUS
  Administered 2014-07-16: 160 mg via INTRAVENOUS

## 2014-07-16 MED ORDER — ONDANSETRON HCL 4 MG/2ML IJ SOLN: 4.0000 mg | Freq: Once | INTRAMUSCULAR | Status: DC | PRN

## 2014-07-16 MED ORDER — ROCURONIUM BROMIDE 50 MG/5ML IV SOLN
INTRAVENOUS | Status: AC
  Filled 2014-07-16: qty 1

## 2014-07-16 MED ORDER — BUPIVACAINE-EPINEPHRINE 0.25% -1:200000 IJ SOLN
INTRAMUSCULAR | Status: DC | PRN
  Administered 2014-07-16: 7 mL

## 2014-07-16 MED ORDER — OXYCODONE-ACETAMINOPHEN 5-325 MG PO TABS
1.0000 | ORAL_TABLET | ORAL | Status: DC | PRN
  Administered 2014-07-16: 1 via ORAL
  Filled 2014-07-16: qty 1

## 2014-07-16 MED ORDER — ARTIFICIAL TEARS OP OINT
TOPICAL_OINTMENT | OPHTHALMIC | Status: AC
  Filled 2014-07-16: qty 3.5

## 2014-07-16 MED ORDER — GLYCOPYRROLATE 0.2 MG/ML IJ SOLN
INTRAMUSCULAR | Status: DC | PRN
  Administered 2014-07-16: .4 mg via INTRAVENOUS

## 2014-07-16 MED ORDER — FENTANYL CITRATE (PF) 100 MCG/2ML IJ SOLN
25.0000 ug | INTRAMUSCULAR | Status: DC | PRN
  Administered 2014-07-16 (×2): 50 ug via INTRAVENOUS

## 2014-07-16 MED ORDER — TRAMADOL HCL 50 MG PO TABS
50.0000 mg | ORAL_TABLET | Freq: Four times a day (QID) | ORAL | Status: DC | PRN
  Administered 2014-07-17: 50 mg via ORAL
  Filled 2014-07-16 (×2): qty 1

## 2014-07-16 MED ORDER — SODIUM CHLORIDE 0.9 % IV SOLN
INTRAVENOUS | Status: DC | PRN
  Administered 2014-07-16: 10 mL

## 2014-07-16 MED ORDER — PROPOFOL 10 MG/ML IV BOLUS
INTRAVENOUS | Status: AC
  Filled 2014-07-16: qty 20

## 2014-07-16 MED ORDER — FENTANYL CITRATE (PF) 100 MCG/2ML IJ SOLN
50.0000 ug | Freq: Once | INTRAMUSCULAR | Status: AC
  Administered 2014-07-16: 50 ug via INTRAVENOUS

## 2014-07-16 MED ORDER — LACTATED RINGERS IV SOLN
INTRAVENOUS | Status: DC | PRN
Start: 2014-07-16 — End: 2014-07-16
  Administered 2014-07-16 (×2): via INTRAVENOUS

## 2014-07-16 MED ORDER — BUPIVACAINE-EPINEPHRINE (PF) 0.25% -1:200000 IJ SOLN
INTRAMUSCULAR | Status: AC
  Filled 2014-07-16: qty 30

## 2014-07-16 MED ORDER — DEXAMETHASONE SODIUM PHOSPHATE 4 MG/ML IJ SOLN
INTRAMUSCULAR | Status: DC | PRN
  Administered 2014-07-16: 4 mg via INTRAVENOUS

## 2014-07-16 MED ORDER — LIDOCAINE HCL 4 % MT SOLN
OROMUCOSAL | Status: DC | PRN
  Administered 2014-07-16: 4 mL via TOPICAL

## 2014-07-16 MED ORDER — SODIUM CHLORIDE 0.9 % IR SOLN
Status: DC | PRN
  Administered 2014-07-16: 1000 mL

## 2014-07-16 MED ORDER — MIDAZOLAM HCL 2 MG/2ML IJ SOLN
INTRAMUSCULAR | Status: AC
Start: 2014-07-16 — End: 2014-07-16
  Filled 2014-07-16: qty 2

## 2014-07-16 MED ORDER — MIDAZOLAM HCL 5 MG/5ML IJ SOLN
INTRAMUSCULAR | Status: DC | PRN
  Administered 2014-07-16: 1 mg via INTRAVENOUS

## 2014-07-16 MED ORDER — PHENYLEPHRINE HCL 10 MG/ML IJ SOLN
10.0000 mg | INTRAVENOUS | Status: DC | PRN
  Administered 2014-07-16: 5 ug/min via INTRAVENOUS

## 2014-07-16 MED ORDER — GLYCOPYRROLATE 0.2 MG/ML IJ SOLN
INTRAMUSCULAR | Status: AC
  Filled 2014-07-16: qty 2

## 2014-07-16 MED ORDER — FENTANYL CITRATE (PF) 100 MCG/2ML IJ SOLN
INTRAMUSCULAR | Status: AC
  Administered 2014-07-16: 50 ug via INTRAVENOUS
  Filled 2014-07-16: qty 2

## 2014-07-16 SURGICAL SUPPLY — 42 items
APPLIER CLIP ROT 10 11.4 M/L (STAPLE) ×3
BENZOIN TINCTURE PRP APPL 2/3 (GAUZE/BANDAGES/DRESSINGS) ×3 IMPLANT
BLADE SURG ROTATE 9660 (MISCELLANEOUS) IMPLANT
CANISTER SUCTION 2500CC (MISCELLANEOUS) ×3 IMPLANT
CHLORAPREP W/TINT 26ML (MISCELLANEOUS) ×3 IMPLANT
CLIP APPLIE ROT 10 11.4 M/L (STAPLE) ×1 IMPLANT
COVER MAYO STAND STRL (DRAPES) ×3 IMPLANT
COVER SURGICAL LIGHT HANDLE (MISCELLANEOUS) ×3 IMPLANT
DRAPE C-ARM 42X72 X-RAY (DRAPES) ×3 IMPLANT
DRSG TEGADERM 2-3/8X2-3/4 SM (GAUZE/BANDAGES/DRESSINGS) ×3 IMPLANT
DRSG TEGADERM 4X4.75 (GAUZE/BANDAGES/DRESSINGS) ×3 IMPLANT
ELECT REM PT RETURN 9FT ADLT (ELECTROSURGICAL) ×3
ELECTRODE REM PT RTRN 9FT ADLT (ELECTROSURGICAL) ×1 IMPLANT
ENDOLOOP SUT PDS II  0 18 (SUTURE) ×6
ENDOLOOP SUT PDS II 0 18 (SUTURE) ×3 IMPLANT
FILTER SMOKE EVAC LAPAROSHD (FILTER) ×3 IMPLANT
GAUZE SPONGE 2X2 8PLY STRL LF (GAUZE/BANDAGES/DRESSINGS) ×1 IMPLANT
GLOVE BIO SURGEON STRL SZ7 (GLOVE) ×3 IMPLANT
GLOVE BIOGEL PI IND STRL 7.5 (GLOVE) ×1 IMPLANT
GLOVE BIOGEL PI INDICATOR 7.5 (GLOVE) ×2
GOWN STRL REUS W/ TWL LRG LVL3 (GOWN DISPOSABLE) ×3 IMPLANT
GOWN STRL REUS W/TWL LRG LVL3 (GOWN DISPOSABLE) ×6
KIT BASIN OR (CUSTOM PROCEDURE TRAY) ×3 IMPLANT
KIT ROOM TURNOVER OR (KITS) ×3 IMPLANT
NS IRRIG 1000ML POUR BTL (IV SOLUTION) ×3 IMPLANT
PAD ARMBOARD 7.5X6 YLW CONV (MISCELLANEOUS) ×3 IMPLANT
POUCH SPECIMEN RETRIEVAL 10MM (ENDOMECHANICALS) ×3 IMPLANT
SCISSORS LAP 5X35 DISP (ENDOMECHANICALS) ×3 IMPLANT
SET CHOLANGIOGRAPH 5 50 .035 (SET/KITS/TRAYS/PACK) ×3 IMPLANT
SET IRRIG TUBING LAPAROSCOPIC (IRRIGATION / IRRIGATOR) ×3 IMPLANT
SLEEVE ENDOPATH XCEL 5M (ENDOMECHANICALS) ×3 IMPLANT
SPECIMEN JAR SMALL (MISCELLANEOUS) ×3 IMPLANT
SPONGE GAUZE 2X2 STER 10/PKG (GAUZE/BANDAGES/DRESSINGS) ×2
SUT MNCRL AB 4-0 PS2 18 (SUTURE) ×3 IMPLANT
SUT VICRYL 0 UR6 27IN ABS (SUTURE) ×3 IMPLANT
TOWEL OR 17X24 6PK STRL BLUE (TOWEL DISPOSABLE) ×3 IMPLANT
TOWEL OR 17X26 10 PK STRL BLUE (TOWEL DISPOSABLE) ×3 IMPLANT
TRAY LAPAROSCOPIC MC (CUSTOM PROCEDURE TRAY) ×3 IMPLANT
TROCAR XCEL BLUNT TIP 100MML (ENDOMECHANICALS) ×3 IMPLANT
TROCAR XCEL NON-BLD 11X100MML (ENDOMECHANICALS) ×3 IMPLANT
TROCAR XCEL NON-BLD 5MMX100MML (ENDOMECHANICALS) ×3 IMPLANT
TUBING INSUFFLATION (TUBING) ×3 IMPLANT

## 2014-07-16 NOTE — Anesthesia Postprocedure Evaluation (Signed)
  Anesthesia Post-op Note  Patient: Debra Blackburn  Procedure(s) Performed: Procedure(s): LAPAROSCOPIC CHOLECYSTECTOMY WITH INTRAOPERATIVE CHOLANGIOGRAM (N/A)  Patient Location: PACU  Anesthesia Type:General  Level of Consciousness: awake, alert  and oriented  Airway and Oxygen Therapy: Patient Spontanous Breathing and Patient connected to nasal cannula oxygen  Post-op Pain: mild  Post-op Assessment: Post-op Vital signs reviewed, Patient's Cardiovascular Status Stable, Respiratory Function Stable, Patent Airway and Pain level controlled              Post-op Vital Signs: stable  Last Vitals:  Filed Vitals:   07/16/14 1300  BP:   Pulse:   Temp: 36.7 C  Resp:     Complications: No apparent anesthesia complications

## 2014-07-16 NOTE — Progress Notes (Signed)
Patient still in recovery room but IOC reviewed and tiny stone versus air bubble seen that should pass without incident with her sphincterotomy and her liver tests have decreased significantly and please call me if I could be of any further assistance otherwise recheck liver tests tomorrow and advance diet per surgical team and if needed we are happy to repeat lab work in our office on the day of surgical outpatient follow-up just to be sure everything is back to normal

## 2014-07-16 NOTE — Op Note (Signed)
Laparoscopic Cholecystectomy with IOC Procedure Note  Indications: This patient presents with symptomatic gallbladder disease and will undergo laparoscopic cholecystectomy.  She presented with jaundice and multiple gallstones/ choledocholithiasis.  Bilirubin was up to 8.1.  ERCP was successful in clearing her CBD.  She presents now for cholecystectomy.  Pre-operative Diagnosis: Calculus of gallbladder with other cholecystitis and obstruction  Post-operative Diagnosis: Same  Surgeon: Keimya Briddell K.   Assistants: none  Anesthesia: General endotracheal anesthesia  ASA Class: 2  Procedure Details  The patient was seen again in the Holding Room. The risks, benefits, complications, treatment options, and expected outcomes were discussed with the patient. The possibilities of reaction to medication, pulmonary aspiration, perforation of viscus, bleeding, recurrent infection, finding a normal gallbladder, the need for additional procedures, failure to diagnose a condition, the possible need to convert to an open procedure, and creating a complication requiring transfusion or operation were discussed with the patient. The likelihood of improving the patient's symptoms with return to their baseline status is good.  The patient and/or family concurred with the proposed plan, giving informed consent. The site of surgery properly noted. The patient was taken to Operating Room, identified as Kimila Atilano and the procedure verified as Laparoscopic Cholecystectomy with Intraoperative Cholangiogram. A Time Out was held and the above information confirmed.  Prior to the induction of general anesthesia, antibiotic prophylaxis was administered. General endotracheal anesthesia was then administered and tolerated well. After the induction, the abdomen was prepped with Chloraprep and draped in the sterile fashion. The patient was positioned in the supine position.  Local anesthetic agent was injected into the  skin near the umbilicus and an incision made. We dissected down to the abdominal fascia with blunt dissection.  The fascia was incised vertically and we entered the peritoneal cavity bluntly.  A pursestring suture of 0-Vicryl was placed around the fascial opening.  The Hasson cannula was inserted and secured with the stay suture.  Pneumoperitoneum was then created with CO2 and tolerated well without any adverse changes in the patient's vital signs. An 11-mm port was placed in the subxiphoid position.  Two 5-mm ports were placed in the right upper quadrant. All skin incisions were infiltrated with a local anesthetic agent before making the incision and placing the trocars.   We positioned the patient in reverse Trendelenburg, tilted slightly to the patient's left.  The gallbladder was identified and was packed full of gallstones.  It was very difficult to grasp.  We were able to grasp the fundus with a clamp and retracted cephalad. Adhesions were lysed bluntly and with the electrocautery where indicated, taking care not to injure any adjacent organs or viscus. The infundibulum was grasped and retracted laterally, exposing the peritoneum overlying the triangle of Calot. This was then divided and exposed in a blunt fashion. A critical view of the cystic duct and cystic artery was obtained.  The cystic duct was clearly identified and bluntly dissected circumferentially. The cystic duct is fairly large in diameter.  The cystic duct was ligated with a clip distally.   An incision was made in the cystic duct and a large stone popped out of the cystic duct.  The [facility] introduced and threaded into the cystic duct. The catheter was secured using a clip. A cholangiogram was then obtained which showed good visualization of the distal and proximal biliary tree with no sign of obstruction.  Contrast flowed easily into the duodenum. There is a small filling defect in the common hepatic duct that  may be an  air bubble vs. tiny stone.  The catheter was then removed.   The cystic duct was then divided and ligated with an Endoloop. The cystic artery was identified, dissected free, ligated with clips and divided as well.   The gallbladder was dissected from the liver bed in retrograde fashion with the electrocautery. The gallbladder was removed and placed in an Endocatch sac. The liver bed was irrigated and inspected. Hemostasis was achieved with the electrocautery. Copious irrigation was utilized and was repeatedly aspirated until clear.  The gallbladder and Endocatch sac were then removed through the umbilical port site.  We had to enlarge the fascial opening to remove the very large gallbladder.  The pursestring suture and an additional 0 Vicryl were used to close the umbilical fascia.    We again inspected the right upper quadrant for hemostasis.  Pneumoperitoneum was released as we removed the trocars.  4-0 Monocryl was used to close the skin.   Benzoin, steri-strips, and clean dressings were applied. The patient was then extubated and brought to the recovery room in stable condition. Instrument, sponge, and needle counts were correct at closure and at the conclusion of the case.   Findings: Cholecystitis with Cholelithiasis  Estimated Blood Loss: less than 50 mL         Drains: none3         Specimens: Gallbladder           Complications: None; patient tolerated the procedure well.         Disposition: PACU - hemodynamically stable.         Condition: stable  Wilmon Arms. Corliss Skains, MD, [facility]  General/ Trauma Surgery  07/16/2014 12:15 PM

## 2014-07-16 NOTE — Anesthesia Preprocedure Evaluation (Signed)
Anesthesia Evaluation  Patient identified by MRN, date of birth, ID band Patient awake    Reviewed: Allergy & Precautions, NPO status , Patient's Chart, lab work & pertinent test results  Airway Mallampati: II  TM Distance: >3 FB Neck ROM: Full    Dental  (+) Teeth Intact, Dental Advisory Given   Pulmonary  breath sounds clear to auscultation        Cardiovascular Rhythm:Regular Rate:Normal     Neuro/Psych    GI/Hepatic   Endo/Other    Renal/GU      Musculoskeletal   Abdominal   Peds  Hematology   Anesthesia Other Findings   Reproductive/Obstetrics                             Anesthesia Physical Anesthesia Plan  ASA: II  Anesthesia Plan: General   Post-op Pain Management:    Induction: Intravenous  Airway Management Planned: Oral ETT  Additional Equipment:   Intra-op Plan:   Post-operative Plan:   Informed Consent: I have reviewed the patients History and Physical, chart, labs and discussed the procedure including the risks, benefits and alternatives for the proposed anesthesia with the patient or authorized representative who has indicated his/her understanding and acceptance.   Dental advisory given  Plan Discussed with: CRNA and Anesthesiologist  Anesthesia Plan Comments:         Anesthesia Quick Evaluation  

## 2014-07-16 NOTE — Transfer of Care (Signed)
Immediate Anesthesia Transfer of Care Note  Patient: Debra Blackburn  Procedure(s) Performed: Procedure(s): LAPAROSCOPIC CHOLECYSTECTOMY WITH INTRAOPERATIVE CHOLANGIOGRAM (N/A)  Patient Location: PACU  Anesthesia Type:General  Level of Consciousness: oriented and sedated  Airway & Oxygen Therapy: Patient Spontanous Breathing and Patient connected to nasal cannula oxygen  Post-op Assessment: Report given to RN and Post -op Vital signs reviewed and stable  Post vital signs: Reviewed and stable  Last Vitals:  Filed Vitals:   07/16/14 1221  BP:   Pulse: 95  Temp: 36.9 C  Resp: 14    Complications: No apparent anesthesia complications

## 2014-07-16 NOTE — Progress Notes (Signed)
Pt for lap chole consent signed given Morphine prior to transfer, alert and oriented no s/s of distress noted.

## 2014-07-16 NOTE — Anesthesia Procedure Notes (Signed)
Procedure Name: Intubation Date/Time: 07/16/2014 10:40 AM Performed by: Merdis Delay Pre-anesthesia Checklist: Patient identified, Timeout performed, Emergency Drugs available, Suction available and Patient being monitored Patient Re-evaluated:Patient Re-evaluated prior to inductionOxygen Delivery Method: Circle system utilized Preoxygenation: Pre-oxygenation with 100% oxygen Intubation Type: IV induction Ventilation: Mask ventilation without difficulty and Oral airway inserted - appropriate to patient size Laryngoscope Size: Mac and 3 Grade View: Grade I Tube type: Oral Tube size: 7.0 mm Number of attempts: 1 Airway Equipment and Method: Stylet and LTA kit utilized Placement Confirmation: ETT inserted through vocal cords under direct vision,  breath sounds checked- equal and bilateral,  positive ETCO2 and CO2 detector Secured at: 22 cm Tube secured with: Tape Dental Injury: Teeth and Oropharynx as per pre-operative assessment

## 2014-07-17 DIAGNOSIS — K8010 Calculus of gallbladder with chronic cholecystitis without obstruction: Secondary | ICD-10-CM | POA: Diagnosis present

## 2014-07-17 DIAGNOSIS — K8309 Other cholangitis: Secondary | ICD-10-CM | POA: Diagnosis present

## 2014-07-17 LAB — CBC
HCT: 35.9 % — ABNORMAL LOW (ref 36.0–46.0)
HEMOGLOBIN: 11.6 g/dL — AB (ref 12.0–15.0)
MCH: 25.4 pg — AB (ref 26.0–34.0)
MCHC: 32.3 g/dL (ref 30.0–36.0)
MCV: 78.6 fL (ref 78.0–100.0)
PLATELETS: 279 10*3/uL (ref 150–400)
RBC: 4.57 MIL/uL (ref 3.87–5.11)
RDW: 14.7 % (ref 11.5–15.5)
WBC: 11 10*3/uL — ABNORMAL HIGH (ref 4.0–10.5)

## 2014-07-17 LAB — COMPREHENSIVE METABOLIC PANEL
ALK PHOS: 251 U/L — AB (ref 38–126)
ALT: 253 U/L — ABNORMAL HIGH (ref 14–54)
AST: 108 U/L — ABNORMAL HIGH (ref 15–41)
Albumin: 2.9 g/dL — ABNORMAL LOW (ref 3.5–5.0)
Anion gap: 8 (ref 5–15)
BUN: <5 mg/dL — ABNORMAL LOW (ref 6–20)
CALCIUM: 8.8 mg/dL — AB (ref 8.9–10.3)
CO2: 30 mmol/L (ref 22–32)
Chloride: 99 mmol/L — ABNORMAL LOW (ref 101–111)
Creatinine, Ser: 0.45 mg/dL (ref 0.44–1.00)
GFR calc Af Amer: >60 mL/min (ref >60)
GFR calc non Af Amer: >60 mL/min (ref >60)
GLUCOSE: 109 mg/dL — AB (ref 65–99)
Potassium: 3.9 mmol/L (ref 3.5–5.1)
Sodium: 137 mmol/L (ref 135–145)
Total Bilirubin: 1.9 mg/dL — ABNORMAL HIGH (ref 0.3–1.2)
Total Protein: 7 g/dL (ref 6.5–8.1)

## 2014-07-17 MED ORDER — HYDROCODONE-ACETAMINOPHEN 5-325 MG PO TABS: 1.0000 | ORAL_TABLET | Freq: Four times a day (QID) | ORAL | Status: DC | PRN

## 2014-07-17 MED ORDER — TRAMADOL HCL 50 MG PO TABS: 50.0000 mg | ORAL_TABLET | Freq: Four times a day (QID) | ORAL | Status: DC | PRN

## 2014-07-17 MED ORDER — HYDROCODONE-ACETAMINOPHEN 5-325 MG PO TABS
1.0000 | ORAL_TABLET | ORAL | Status: DC | PRN
  Filled 2014-07-17: qty 2

## 2014-07-17 NOTE — Progress Notes (Signed)
Debra Blackburn 1:05 PM  Subjective: Patient doing well status post cholecystectomy and her case was discussed yesterday with the surgical team and she wants to go home and has no new complaints  Objective: Vital signs stable afebrile no acute distress patient not examined today in good spirits liver tests continue to decrease white count up slightly  Assessment: Status post ERCP and laparoscopic cholecystectomy  Plan: Will arrange for rechecking liver tests in our office the day of surgical follow-up and happy to see back sooner when necessary  [facility],[name] E  Pager [phone] After 5PM or if no answer call [phone]

## 2014-07-17 NOTE — Discharge Instructions (Signed)
CIRUGIA LAPAROSCOPICA: INSTRUCCIONES DE POST OPERATORIO.  Revise siempre los documentos que le entreguen en el lugar donde se ha hecho la [place].  SI USTED NECESITA DOCUMENTOS DE INCAPACIDAD (DISABLE) O DE PERMISO FAMILAR (FAMILY LEAVE) NECESITA TRAERLOS A LA OFICINA PARA QUE SEAN PROCESADOS. NO  SE LOS DE A SU DOCTOR. 1. A su alta del hospital se le dara una receta para Financial controller. Tomela como ha sido recetada, si la necesita. Si no la necesita puede tomar, Acetaminofen (Tylenol) o Ibuprofen (Advil) para aliviar dolor moderado. 2. Continue tomando el resto de sus medicinas. 3. Si necesita rellenar la receta, llame a la farmacia. ellos contactan a nuestra oficina pidiendo autorizacion. Este tipo de receta no pueden ser [employer] de las  5pm o [employer] fines de [place]. 4. Con relacion a la dieta: debe ser [employer] primeros dias despues que llege a la casa. Ejemplo: sopas y galleticas. Tome bastante liquido esos dias. 5. La mayoria de los pacientes padecen de inflamacion y cambio de coloracion de la piel alrededor de las incisiones. esto toma dias en resolver.  pnerse una bolsa de hielo en el area affectada ayuda..  6. Es comun tambien tener un poco de estrenimiento si esta tomado medicinas para Conservation officer, historic buildings. incremente la cantidad de liquidos a tomar y Doctor, hospital (Colace) esto previene el problema. Si ya tiene estrenimiento, es Software engineer no ha defecado en 48 horas, puede tomar un laxativo (Milk of Magnesia or Miralax) uselo como el paquete le explica. 7.  A menos que se le diga algo diferente. Remueva el bendaje a las 24-48 horas despues dela [place]. y puede banarse en la ducha sin ningun problema. usted puede tener steri-strips (pequenas curitas transparentes en la piel puesta encima de la incision)  Estas banditas strips should be left on the skin for 7-10 days.   Si su cirujano puso pegamento encima de la incision usted puede banarse bajo la ducha en 24 horas. Este pegamento empezara a  caerse en las proximas 2-3 semanas. Si le pusieron suturas o presillas (grapos) estos seran quitados en su proxima cita en la oficina. Marland Kitchen a. ACTIVIDADES:  Puede hacer actividad ligera.  Como caminar , subir escaleras y poco a poco irlas incrementando tanto como las [place]. Puede tener relaciones sexuales cuando sea comfortable. No carge objetos pesados o haga esfuerzos que no sean aprovados por su doctor. b. Puede manejar en cuanto no esta tomando medicamentos fuertes (narcoticos) para Conservation officer, historic buildings, pueda abrochar confortablemente el cinturon de seguridad, y pueda Psychologist, counselling y usar los pedales de su vehiculo con seguridad. c. Letts  8. Debe ver a su doctor para una cita de seguimiento en 2-3 semanas despues de la [place].  9. OTRAS ISNSTRUCCIONES:___________________________________________________________________________________ Angelina Pih A SU MEDICO: 1. FIEBRE mayor de  101.0 2. No produccion de [place]. 3. Sangramiento continue de la herida 4. Incremento de dolor, enrojecimientio o drenaje de la herida (incision) 5. Incremento de dolor abdominal.  The clinic staff is available to answer your questions during regular business hours.  Please dont hesitate to call and ask to speak to one of the nurses for clinical concerns.  If you have a medical emergency, go to the nearest emergency room or call 911.  A surgeon from [facility] is always on call at the hospital. [address], [place], [place], [place]  [address] ? [address], [place], [place]   [address] [phone] ? [phone] ? FAX [phone] Web site: [email or web address]  Your appointment is at 2:30pm, please arrive at least 30 min before your appointment to complete your check in paperwork.  If you are unable to arrive 30 min prior to your appointment time we may have to cancel or reschedule you.  LAPAROSCOPIC SURGERY: POST OP INSTRUCTIONS  1. DIET: Follow a light bland diet the  first 24 hours after arrival home, such as soup, liquids, crackers, etc. Be sure to include lots of fluids daily. Avoid fast food or heavy meals as your are more likely to get nauseated. Eat a low fat the next few days after surgery.  2. Take your usually prescribed home medications unless otherwise directed. 3. PAIN CONTROL:  1. Pain is best controlled by a usual combination of three different methods TOGETHER:  1. Ice/Heat 2. Over the counter pain medication 3. Prescription pain medication 2. Most patients will experience some swelling and bruising around the incisions. Ice packs or heating pads (30-60 minutes up to 6 times a day) will help. Use ice for the first few days to help decrease swelling and bruising, then switch to heat to help relax tight/sore spots and speed recovery. Some people prefer to use ice alone, heat alone, alternating between ice & heat. Experiment to what works for you. Swelling and bruising can take several weeks to resolve.  3. It is helpful to take an over-the-counter pain medication regularly for the first few weeks. Choose one of the following that works best for you:  1. Naproxen (Aleve, etc) Two 220mg  tabs twice a day 2. Ibuprofen (Advil, etc) Three 200mg  tabs four times a day (every meal & bedtime) 3. Acetaminophen (Tylenol, etc) 500-650mg  four times a day (every meal & bedtime) 4. A prescription for pain medication (such as oxycodone, hydrocodone, etc) should be given to you upon discharge. Take your pain medication as prescribed.  1. If you are having problems/concerns with the prescription medicine (does not control pain, nausea, vomiting, rash, itching, etc), please call us [phone] to see if we need to switch you to a different pain medicine that will work better for you and/or control your side effect better. 2. If you need a refill on your pain medication, please contact your pharmacy. They will contact our office to request authorization. Prescriptions  will not be filled after 5 pm or on week-ends. 4. Avoid getting constipated. Between the surgery and the pain medications, it is common to experience some constipation. Increasing fluid intake and taking a fiber supplement (such as Metamucil, Citrucel, FiberCon, MiraLax, etc) 1-2 times a day regularly will usually help prevent this problem from occurring. A mild laxative (prune juice, Milk of Magnesia, MiraLax, etc) should be taken according to package directions if there are no bowel movements after 48 hours.  5. Watch out for diarrhea. If you have many loose bowel movements, simplify your diet to bland foods & liquids for a few days. Stop any stool softeners and decrease your fiber supplement. Switching to mild anti-diarrheal medications (Kayopectate, Pepto Bismol) can help. If this worsens or does not improve, please call us. 6. Wash / shower every day. You may shower over the dressings as they are waterproof. Continue to shower over incision(s) after the dressing is off. 7. Remove your waterproof bandages 5 days after surgery. You may leave the incision open to air. You may replace a dressing/Band-Aid to cover the incision for comfort if you wish.  8. ACTIVITIES as tolerated:  1. You may resume regular (light) daily activities beginning the next day--such  as daily self-care, walking, climbing stairs--gradually increasing activities as tolerated. If you can walk 30 minutes without difficulty, it is safe to try more intense activity such as jogging, treadmill, bicycling, low-impact aerobics, swimming, etc. 2. Save the most intensive and strenuous activity for last such as sit-ups, heavy lifting, contact sports, etc Refrain from any heavy lifting or straining until you are off narcotics for pain control.  3. DO NOT PUSH THROUGH PAIN. Let pain be your guide: If it hurts to do something, don't do it. Pain is your body warning you to avoid that activity for another week until the pain goes down. 4. You may  drive when you are no longer taking prescription pain medication, you can comfortably wear a seatbelt, and you can safely maneuver your car and apply brakes. 5. You may have sexual intercourse when it is comfortable.  9. FOLLOW UP in our office  1. Please call [facility] at [phone] to set up an appointment to see your surgeon in the office for a follow-up appointment approximately 2-3 weeks after your surgery. 2. Make sure that you call for this appointment the day you arrive home to insure a convenient appointment time.      10. IF YOU HAVE DISABILITY OR FAMILY LEAVE FORMS, BRING THEM TO THE               OFFICE FOR PROCESSING.   WHEN TO CALL us [phone]:  1. Poor pain control 2. Reactions / problems with new medications (rash/itching, nausea, etc)  3. Fever over 101.5 F (38.5 C) 4. Inability to urinate 5. Nausea and/or vomiting 6. Worsening swelling or bruising 7. Continued bleeding from incision. 8. Increased pain, redness, or drainage from the incision  The clinic staff is available to answer your questions during regular business hours (8:30am-5pm). Please dont hesitate to call and ask to speak to one of our nurses for clinical concerns.  If you have a medical emergency, go to the nearest emergency room or call 911.  A surgeon from [facility] is always on call at the [facility], [place]  [address], [place], [place] [address] ?  MAIN: [phone] ? TOLL FREE: [phone] ?  FAX [phone]  [email or web address]

## 2014-07-17 NOTE — Progress Notes (Signed)
Patient ready for discharge. Reviewed discharge summary and all medications with patient. Patient received Rx and work note.

## 2014-07-17 NOTE — Discharge Summary (Signed)
[facility] Discharge Summary   Patient ID: Debra Blackburn MRN: [id number] DOB/AGE: 28/03/1985 28 y.o.  Admit date: 07/14/2014 Discharge date: 07/17/2014  Admitting Diagnosis: Choledocholithiasis  Discharge Diagnosis Patient Active Problem List   Diagnosis Date Noted  . Acute cholangitis 07/17/2014  . Cholecystitis with cholelithiasis 07/17/2014  . Choledocholithiasis 07/14/2014    Consultants Dr. Matthias HughsBuccini, Dr. [place] SchleinMagod (GI)  Imaging: Dg Cholangiogram Operative  07/16/2014   CLINICAL DATA:  Laparoscopic cholecystectomy  EXAM: INTRAOPERATIVE CHOLANGIOGRAM  TECHNIQUE: Cholangiographic images from the C-arm fluoroscopic device were submitted for interpretation post-operatively. Please see the procedural report for the amount of contrast and the fluoroscopy time utilized.  COMPARISON:  ERCP 07/15/2014  FLUOROSCOPY TIME:  0 minutes 7.6 seconds  FINDINGS: Contrast was injected into cystic duct stump during laparoscopic cholecystectomy and series of images was obtained.  Normal opacification of the biliary tree identified.  Normal caliber of cystic duct, common hepatic duct and common bile duct.  Patent CBD with free spillage of contrast into duodenum.  Small amount of contrast extravasation at the injection site at the cystic duct stump noted.  Visualized intrahepatic radicles normal appearance.  Single tiny mobile filling defect is seen to the confluence of the common hepatic duct, could represent a tiny stone or an air bubble.  No other definite persistent intraluminal filling defects identified.  IMPRESSION: Single tiny filling defect at the confluence of the common hepatic duct either representing a tiny air bubble or a tiny stone.  Extravasation of a small amount of contrast material at the injection site at the cystic duct stump.   Electronically Signed   By: Ulyses SouthwardMark  Boles M.D.   On: 07/16/2014 11:45    Procedures Dr. Corliss Skainssuei (07/16/14) - Laparoscopic Cholecystectomy with IOC Dr.  [place] SchleinMagod (07/15/14) - ERCP with sphincterotomy   Hospital Course:  29 yo Hispanic female who speaks minimal English, began having epigastric abdominal pain on Tuesday. She has had some nausea and vomiting. She has barely eaten since then as it makes her nausea worse. She had diarrhea on Tuesday, but none since then. She denies fevers. She states yesterday her urine began to darken. She denies skin color or icteric changes. [facility] further evaluation due to pain. She had an US that revealed cholelithiasis with possible wall thickening as well as a 9mm CBD with a distal calculus causing an obstruction. Her TB is 8.3 and her other LFTs are elevated as well. Her WBC is normal with no left shift. We have been asked to see her.  Patient was admitted and underwent procedures listed above one day apart.  Tolerated procedures well and was transferred to the floor.  Diet was advanced as tolerated.  On POD #1, the patient was voiding well, tolerating diet, ambulating well, pain well controlled, vital signs stable, incisions c/d/i and felt stable for discharge home.  Patient will follow up in our office in 3 weeks and knows to call with questions or concerns.  We have asked her to get repeat labs next week to verify the LFT's continue to trend down.     Physical Exam: General:  Alert, NAD, pleasant, comfortable Abd:  Soft, mildly distended, mild tenderness, incisions C/D/I    Medication List    TAKE these medications        traMADol 50 MG tablet  Commonly known as:  ULTRAM  Take 1 tablet (50 mg total) by mouth every 6 (six) hours as needed for moderate pain.  Follow-up Information    Follow up with [facility] [place]. Go on 08/09/2014.   Why:  For post-operation check. Your appointment is at 2:30pm, please arrive at least 30 min before your appointment to complete your check in paperwork.  If you are unable to arrive 30 min prior to your appointment time we may have to  cancel or reschedule you   Contact information:   [address] [place] [address] [phone]      Go in 1 week to follow up.   Why:  Go to the labarotory of your choice and take the prescription to get your liver tests rechecked in 1 week.      Schedule an appointment as soon as possible for a visit with Florencia Reasons, MD.   Specialty:  Gastroenterology   Why:  For post-hospital follow up, As needed (This is the GI doctor that saw you)   Contact information:   [address] [place] [address] [phone]       Signed: [name], [facility] [phone]  07/17/2014, 9:54 AM

## 2014-07-17 NOTE — Progress Notes (Signed)
[facility] Progress Note  1 Day Post-Op  Subjective: Pt is very sore.  No N/V, ambulating well, feels bloated.  Hasn't tried oral pain meds yet.  Husband at bedside.  She'd like to go home.  Was on clear liquids.    Objective: Vital signs in last 24 hours: Temp:  [98.1 F (36.7 C)-99.5 F (37.5 C)] 99.5 F (37.5 C) (07/03 0628) Pulse Rate:  [89-116] 116 (07/03 0628) Resp:  [12-18] 17 (07/03 0628) BP: (105-121)/(61-76) 105/61 mmHg (07/03 0628) SpO2:  [92 %-100 %] 98 % (07/03 0628)    Intake/Output from previous day: 07/02 0701 - 07/03 0700 In: 7211.5 [P.O.:820; I.V.:6391.5] Out: -  Intake/Output this shift:    PE: Gen:  Alert, NAD, pleasant Abd: Soft, some what distended, tender over incisions, +BS, no HSM, incisions C/D/I   Lab Results:   Recent Labs  07/16/14 0725 07/17/14 0415  WBC 7.5 11.0*  HGB 10.7* 11.6*  HCT 33.5* 35.9*  PLT 235 279   BMET  Recent Labs  07/16/14 0725 07/17/14 0415  NA 136 137  K 3.7 3.9  CL 103 99*  CO2 26 30  GLUCOSE 111* 109*  BUN <5* <5*  CREATININE 0.44 0.45  CALCIUM 8.4* 8.8*   PT/INR No results for input(s): LABPROT, INR in the last 72 hours. CMP     Component Value Date/Time   NA 137 07/17/2014 0415   K 3.9 07/17/2014 0415   CL 99* 07/17/2014 0415   CO2 30 07/17/2014 0415   GLUCOSE 109* 07/17/2014 0415   BUN <5* 07/17/2014 0415   CREATININE 0.45 07/17/2014 0415   CALCIUM 8.8* 07/17/2014 0415   PROT 7.0 07/17/2014 0415   ALBUMIN 2.9* 07/17/2014 0415   AST 108* 07/17/2014 0415   ALT 253* 07/17/2014 0415   ALKPHOS 251* 07/17/2014 0415   BILITOT 1.9* 07/17/2014 0415   GFRNONAA >60 07/17/2014 0415   GFRAA >60 07/17/2014 0415   Lipase     Component Value Date/Time   LIPASE 20* 07/14/2014 0901       Studies/Results: Dg Cholangiogram Operative  07/16/2014   CLINICAL DATA:  Laparoscopic cholecystectomy  EXAM: INTRAOPERATIVE CHOLANGIOGRAM  TECHNIQUE: Cholangiographic images from the C-arm  fluoroscopic device were submitted for interpretation post-operatively. Please see the procedural report for the amount of contrast and the fluoroscopy time utilized.  COMPARISON:  ERCP 07/15/2014  FLUOROSCOPY TIME:  0 minutes 7.6 seconds  FINDINGS: Contrast was injected into cystic duct stump during laparoscopic cholecystectomy and series of images was obtained.  Normal opacification of the biliary tree identified.  Normal caliber of cystic duct, common hepatic duct and common bile duct.  Patent CBD with free spillage of contrast into duodenum.  Small amount of contrast extravasation at the injection site at the cystic duct stump noted.  Visualized intrahepatic radicles normal appearance.  Single tiny mobile filling defect is seen to the confluence of the common hepatic duct, could represent a tiny stone or an air bubble.  No other definite persistent intraluminal filling defects identified.  IMPRESSION: Single tiny filling defect at the confluence of the common hepatic duct either representing a tiny air bubble or a tiny stone.  Extravasation of a small amount of contrast material at the injection site at the cystic duct stump.   Electronically Signed   By: Ulyses SouthwardMark  Boles M.D.   On: 07/16/2014 11:45    Anti-infectives: Anti-infectives    Start     Dose/Rate Route Frequency Ordered Stop   07/14/14 1515  cefTRIAXone (  ROCEPHIN) 2 g in dextrose 5 % 50 mL IVPB - Premix     2 g 100 mL/hr over 30 Minutes Intravenous Every 24 hours 07/14/14 1508         Assessment/Plan Choledocholithiasis with elevated TB at 8.3 s/p ERCP on 07/15/14 Dr. [place] Schlein Cholecystitis with cholelithiasis POD #1 s/p lap chole with IOC FEN -- Advance diet to reg, oral pain meds, ice VTE -- SCD's, ambulate and IS Dispo -- D/c today or tomorrow depending on how well she's doing    LOS: 3 days    [name] 07/17/2014, 9:42 AM Pager: [phone]

## 2014-07-19 ENCOUNTER — Encounter (HOSPITAL_COMMUNITY): Payer: Self-pay | Admitting: Surgery

## 2014-07-24 ENCOUNTER — Other Ambulatory Visit (HOSPITAL_COMMUNITY)
Admission: RE | Admit: 2014-07-24 | Discharge: 2014-07-24 | Disposition: A | Discharge status: Home / Self Care | Payer: Self-pay | Source: Ambulatory Visit | Attending: General Surgery | Admitting: General Surgery

## 2014-07-24 DIAGNOSIS — K8010 Calculus of gallbladder with chronic cholecystitis without obstruction: Secondary | ICD-10-CM | POA: Insufficient documentation

## 2014-07-24 LAB — HEPATIC FUNCTION PANEL
ALK PHOS: 136 U/L — AB (ref 38–126)
ALT: 94 U/L — AB (ref 14–54)
AST: 70 U/L — AB (ref 15–41)
Albumin: 3.2 g/dL — ABNORMAL LOW (ref 3.5–5.0)
BILIRUBIN TOTAL: 0.7 mg/dL (ref 0.3–1.2)
Bilirubin, Direct: 0.3 mg/dL (ref 0.1–0.5)
Indirect Bilirubin: 0.4 mg/dL (ref 0.3–0.9)
TOTAL PROTEIN: 8.1 g/dL (ref 6.5–8.1)

## 2014-07-24 LAB — LIPASE, BLOOD: Lipase: 57 U/L — ABNORMAL HIGH (ref 22–51)

## 2015-04-19 ENCOUNTER — Encounter (HOSPITAL_COMMUNITY): Payer: Self-pay

## 2015-04-19 ENCOUNTER — Emergency Department (HOSPITAL_COMMUNITY)
Admission: EM | Admit: 2015-04-19 | Discharge: 2015-04-19 | Disposition: A | Discharge status: Home / Self Care | Payer: Medicaid Other | Attending: Emergency Medicine | Admitting: Emergency Medicine

## 2015-04-19 DIAGNOSIS — N644 Mastodynia: Secondary | ICD-10-CM | POA: Diagnosis not present

## 2015-04-19 DIAGNOSIS — Z3202 Encounter for pregnancy test, result negative: Secondary | ICD-10-CM | POA: Insufficient documentation

## 2015-04-19 DIAGNOSIS — N760 Acute vaginitis: Secondary | ICD-10-CM | POA: Insufficient documentation

## 2015-04-19 DIAGNOSIS — R300 Dysuria: Secondary | ICD-10-CM | POA: Diagnosis present

## 2015-04-19 DIAGNOSIS — B9689 Other specified bacterial agents as the cause of diseases classified elsewhere: Secondary | ICD-10-CM

## 2015-04-19 LAB — WET PREP, GENITAL
Sperm: NONE SEEN
TRICH WET PREP: NONE SEEN
YEAST WET PREP: NONE SEEN

## 2015-04-19 LAB — URINALYSIS, ROUTINE W REFLEX MICROSCOPIC
Bilirubin Urine: NEGATIVE
Glucose, UA: NEGATIVE mg/dL
Hgb urine dipstick: NEGATIVE
KETONES UR: NEGATIVE mg/dL
Leukocytes, UA: NEGATIVE
Nitrite: NEGATIVE
PROTEIN: NEGATIVE mg/dL
Specific Gravity, Urine: 1.026 (ref 1.005–1.030)
pH: 5.5 (ref 5.0–8.0)

## 2015-04-19 LAB — I-STAT CHEM 8, ED
BUN: 9 mg/dL (ref 6–20)
CALCIUM ION: 1.16 mmol/L (ref 1.12–1.23)
CHLORIDE: 103 mmol/L (ref 101–111)
Creatinine, Ser: 0.5 mg/dL (ref 0.44–1.00)
GLUCOSE: 89 mg/dL (ref 65–99)
HCT: 43 % (ref 36.0–46.0)
HEMOGLOBIN: 14.6 g/dL (ref 12.0–15.0)
Potassium: 4 mmol/L (ref 3.5–5.1)
SODIUM: 142 mmol/L (ref 135–145)
TCO2: 24 mmol/L (ref 0–100)

## 2015-04-19 LAB — POC URINE PREG, ED: Preg Test, Ur: NEGATIVE

## 2015-04-19 LAB — RAPID HIV SCREEN (HIV 1/2 AB+AG)
HIV 1/2 Antibodies: NONREACTIVE
HIV-1 P24 Antigen - HIV24: NONREACTIVE

## 2015-04-19 MED ORDER — METRONIDAZOLE 500 MG PO TABS: 500.0000 mg | ORAL_TABLET | Freq: Two times a day (BID) | ORAL | Status: DC

## 2015-04-19 NOTE — ED Notes (Signed)
Pelvic cart set up at bedside  

## 2015-04-19 NOTE — ED Provider Notes (Signed)
CSN: [id number]     Arrival date & time 04/19/15  0820 History   First MD Initiated Contact with Patient 04/19/15 [phone]     Chief Complaint  Patient presents with  . Dysuria     (Consider location/radiation/quality/duration/timing/severity/associated sxs/prior Treatment) HPI   30 year old female who presents with complaints of left armpit pain. Patient states for the past week she has had persistent pain to her left arm pit. Pain is described as a sharp sensation, worsening with palpation or with movement. She mentioned having similar symptoms in the past which resolved without any specific treatment. She denies any associated injury. Furthermore, patient report having urinary discomfort for the past month. She mentioned occasional discomfort with urinating, worsened last night. She denies having urinary frequency or urgency and denies having any associated fever, abdominal pain, back pain, hematuria, vaginal bleeding or vaginal discharge. Her chart listed that patient has a hysterectomy however patient reports her last menstrual period was March 10. She is a G3 P3. Patient is a nonsmoker, denies any significant family history of cancer. She reported being seen by her PCP month ago for her dysuria and was told that she has kidney infection and was given antibiotic. She mentioned her dysuria began shortly after taking the antibiotic and has been persistent. She did attempt to follow-up with [facility] but next Appointment is April 12.    History reviewed. No pertinent past medical history. Past Surgical History  Procedure Laterality Date  . Cesarean section      x3  . Cholecystectomy N/A 07/16/2014    Procedure: LAPAROSCOPIC CHOLECYSTECTOMY WITH INTRAOPERATIVE CHOLANGIOGRAM;  Surgeon: Manus Rudd, MD;  Location: [facility] OR;  Service: General;  Laterality: N/A;  . Endoscopic retrograde cholangiopancreatography (ercp) with propofol N/A 07/15/2014    Procedure: ENDOSCOPIC RETROGRADE  CHOLANGIOPANCREATOGRAPHY (ERCP) WITH PROPOFOL;  Surgeon: Vida Rigger, MD;  Location: [facility] ENDOSCOPY;  Service: Endoscopy;  Laterality: N/A;   No family history on file. Social History  Substance Use Topics  . Smoking status: Never Smoker   . Smokeless tobacco: None  . Alcohol Use: No   OB History    No data available     Review of Systems  All other systems reviewed and are negative.     Allergies  Review of patient's allergies indicates no known allergies.  Home Medications   Prior to Admission medications   Medication Sig Start Date End Date Taking? Authorizing Provider  traMADol (ULTRAM) 50 MG tablet Take 1 tablet (50 mg total) by mouth every 6 (six) hours as needed for moderate pain. 07/17/14   Megan N Baird, PA-C   BP 126/91 mmHg  Pulse 103  Temp(Src) 97.8 F (36.6 C) (Oral)  Ht  (1.651 m)  Wt 64.864 kg  BMI 23.80 kg/m2  SpO2 100% Physical Exam  Constitutional: She appears well-developed and well-nourished. No distress.  HENT:  Head: Atraumatic.  Eyes: Conjunctivae are normal.  Neck: Neck supple.  Cardiovascular: Normal rate and regular rhythm.   Pulmonary/Chest: Effort normal and breath sounds normal.  Chaperone present during exam. Tenderness to palpation of the left upper outer quadrant of the breast and medial aspects of the left axillary region without any overlying skin changes, no palpable nodule, no induration, fluctuance, or erythema. L shoulder with full range of motion.  Patient has similar tenderness noted to her right upper outer quadrant of her breast with similar presentation. No signs of peau d'orange and no nipple discharge.  Abdominal: Soft. She exhibits no distension. There  is no tenderness.  Genitourinary:  No CVA tenderness  Chaperone present during exam. No inguinal lymphadenopathy or inguinal hernia noted. Normal external genitalia. No pain with speculum insertion. Close cervical os with out any significant discharge noted. Os with normal  appearance without rash or lesions. On bimanual examination no adnexal tenderness or cervical motion tenderness.  Neurological: She is alert.  Skin: No rash noted.  Psychiatric: She has a normal mood and affect.  Nursing note and vitals reviewed.   ED Course  Procedures (including critical care time) Labs Review Labs Reviewed  WET PREP, GENITAL - Abnormal; Notable for the following:    Clue Cells Wet Prep HPF POC PRESENT (*)    WBC, Wet Prep HPF POC MANY (*)    All other components within normal limits  URINALYSIS, ROUTINE W REFLEX MICROSCOPIC (NOT AT [facility]) - Abnormal; Notable for the following:    Color, Urine AMBER (*)    APPearance CLOUDY (*)    All other components within normal limits  RAPID HIV SCREEN (HIV 1/2 AB+AG)  RPR  POC URINE PREG, ED  I-STAT CHEM 8, ED  GC/CHLAMYDIA PROBE AMP ([place]) NOT AT [facility]    Imaging Review No results found. I have personally reviewed and evaluated these images and lab results as part of my medical decision-making.   EKG Interpretation None      MDM   Final diagnoses:  Breast pain, left  BV (bacterial vaginosis)    BP 126/91 mmHg  Pulse 103  Temp(Src) 97.8 F (36.6 C) (Oral)  Ht 5\' 5"  (1.651 m)  Wt 64.864 kg  BMI 23.80 kg/m2  SpO2 100%  LMP 03/24/2015   9:13 AM patient here with left axillary left breast pain. Pain is reproducible on exam but no signs of abscess, cellulitis, obvious nodule appreciated. Significant risk factors for breast cancer. She also has similar pain to her right axillary region. Suspect bra strap causing her pain however I will refer pt to our Breast Center for breast US to r/o malignancy.  She report having dysuria x 1 month without frequency/urgency.  Will check UA.   10:29 AM UA without any signs of urinary tract infection. Patient does express concern for her pelvic discomfort therefore a pelvic examination was performed. She has no evidence to suggest pelvic inflammatory disease. STD  screening provided.  12:01 PM Wet prep shows evidence of bacterial vaginosis which she will be treated with Flagyl. Patient will also follow-up with the breast Center for breast ultrasound due to pain in the left axillary region extending to the left upper outer quadrant. Patient made aware that additional testing will take several days for the results come back and if positive, she will be notified. Return precaution discussed.   Fayrene HelperBowie Kristain Filo, PA-C 04/19/15 1202  Margarita Grizzleanielle Ray, MD 04/19/15 1600

## 2015-04-19 NOTE — Discharge Instructions (Signed)
Please call and follow up with the breast center for ultrasound of your left breast for further evaluation of your pain.  Take Flagyl as treatment for bacterial vaginosis.  Follow up with your doctor for further care.  Vaginosis bacteriana (Bacterial Vaginosis) La vaginosis bacteriana es una infeccin de la vagina. Se produce cuando una cantidad excesiva de ciertos grmenes (bacterias) crece en la vagina. Esta infeccin aumenta el riesgo de contraer otras infecciones de transmisin sexual. El tratamiento de esta infeccin puede ayudar a reducir el riesgo de otras infecciones, como:   Clamidia.  Bettey MareGonorrea.  VIH.  Herpes. CUIDADOS EN EL HOGAR  Tome los medicamentos tal como se lo indic su mdico.  Finalice la prescripcin completa, aunque comience a sentirse mejor.  Comunique a sus compaeros sexuales que sufre una infeccin. Deben consultar a su mdico para iniciar un tratamiento.  Durante el tratamiento:  Soil scientistvite mantener relaciones sexuales o use preservativos de Network engineerla forma correcta.  No se haga duchas vaginales.  No consuma alcohol a menos que el mdico lo autorice.  No amamante a menos que el mdico la autorice. SOLICITE AYUDA SI:  No mejora luego de 3 das de [place] Janet[place]atamiento.  Observa una secrecin (prdida) de color gris ms abundante que proviene de la vagina.  Siente ms dolor que antes.  Tiene fiebre. ASEGRESE DE QUE:   Comprende estas instrucciones.  Controlar su afeccin.  Recibir ayuda de inmediato si no mejora o si empeora.   Esta informacin no tiene Theme park managercomo fin reemplazar el consejo del mdico. Asegrese de hacerle al mdico cualquier pregunta que tenga.   Document Released: 03/29/2008 Document Revised: 01/21/2014 Elsevier Interactive Patient Education [employer]2016 Elsevier Inc.

## 2015-04-19 NOTE — ED Notes (Signed)
Patient here with dysuria x 1 week and pain to left axilla, has taken meds for uti with no relief

## 2015-04-20 LAB — GC/CHLAMYDIA PROBE AMP (~~LOC~~) NOT AT ARMC
CHLAMYDIA, DNA PROBE: NEGATIVE
Neisseria Gonorrhea: NEGATIVE

## 2015-04-20 LAB — RPR: RPR: NONREACTIVE

## 2015-04-21 ENCOUNTER — Encounter (HOSPITAL_COMMUNITY): Payer: Self-pay | Admitting: Emergency Medicine

## 2015-04-21 ENCOUNTER — Ambulatory Visit (HOSPITAL_COMMUNITY)
Admission: EM | Admit: 2015-04-21 | Discharge: 2015-04-21 | Disposition: A | Discharge status: Home / Self Care | Payer: Medicaid Other | Attending: Emergency Medicine | Admitting: Emergency Medicine

## 2015-04-21 DIAGNOSIS — B349 Viral infection, unspecified: Secondary | ICD-10-CM

## 2015-04-21 DIAGNOSIS — R102 Pelvic and perineal pain: Secondary | ICD-10-CM | POA: Diagnosis not present

## 2015-04-21 NOTE — Discharge Instructions (Signed)
There are no symptoms that require transfer to the ER at this time.   If your vaginal pain continues you should see a gyn doctor or [facility]  Continue your current medications  You may also wish to consider AZO for the burning with urination.

## 2015-04-21 NOTE — ED Provider Notes (Signed)
CSN: [id number]     Arrival date & time 04/21/15  1324 History   First MD Initiated Contact with Patient 04/21/15 1405     Chief Complaint  Patient presents with  . Vaginal Pain   (Consider location/radiation/quality/duration/timing/severity/associated sxs/prior Treatment) HPI Pt with body aches and chills since last night. No home treatment for symptoms. Also has had vaginal pain which has a complete evaluation in the ED 2 nights ago, states that symptoms are no better.  Currently using antibx.   History reviewed. No pertinent past medical history. Past Surgical History  Procedure Laterality Date  . Cesarean section      x3  . Cholecystectomy N/A 07/16/2014    Procedure: LAPAROSCOPIC CHOLECYSTECTOMY WITH INTRAOPERATIVE CHOLANGIOGRAM;  Surgeon: Manus Rudd, MD;  Location: [facility] OR;  Service: General;  Laterality: N/A;  . Endoscopic retrograde cholangiopancreatography (ercp) with propofol N/A 07/15/2014    Procedure: ENDOSCOPIC RETROGRADE CHOLANGIOPANCREATOGRAPHY (ERCP) WITH PROPOFOL;  Surgeon: Vida Rigger, MD;  [facility] ENDOSCOPY;  Service: Endoscopy;  Laterality: N/A;   No family history on file. Social History  Substance Use Topics  . Smoking status: Never Smoker   . Smokeless tobacco: None  . Alcohol Use: No   OB History    No data available     Review of Systems Body aches and chills, ongoing vaginal pain Allergies  Review of patient's allergies indicates no known allergies.  Home Medications   Prior to Admission medications   Medication Sig Start Date End Date Taking? Authorizing Provider  metroNIDAZOLE (FLAGYL) 500 MG tablet Take 1 tablet (500 mg total) by mouth 2 (two) times daily. 04/19/15   Fayrene Helper, PA-C  traMADol (ULTRAM) 50 MG tablet Take 1 tablet (50 mg total) by mouth every 6 (six) hours as needed for moderate pain. 07/17/14   [name], PA-C   Meds Ordered and Administered this Visit  Medications - No data to display  BP 126/68 mmHg  Pulse 110  Temp(Src)  97.9 F (36.6 C) (Oral)  SpO2 98%  LMP 03/24/2015 No data found.   Physical Exam NURSES NOTES AND VITAL SIGNS REVIEWED. CONSTITUTIONAL: Well developed, well nourished, no acute distress HEENT: normocephalic, atraumatic, right and left TM's are normal EYES: Conjunctiva normal NECK:normal ROM, supple, no adenopathy PULMONARY:No respiratory distress, normal effort, Lungs: CTAb/l, no wheezes, or increased work of breathing CARDIOVASCULAR: RRR, no murmur ABDOMEN: soft, ND, NT, +'ve BS MUSCULOSKELETAL: Normal ROM of all extremities,  SKIN: warm and dry without rash PSYCHIATRIC: Mood and affect, behavior are normal   ED Course  Procedures (including critical care time)  Labs Review Labs Reviewed - No data to display  Imaging Review No results found.   Visual Acuity Review  Right Eye Distance:   Left Eye Distance:   Bilateral Distance:    Right Eye Near:   Left Eye Near:    Bilateral Near:      Follow up with either GYN or women's hosp Finish antibx.    MDM   1. Viral illness   2. Vaginal pain     Patient is reassured that there are no issues that require transfer to higher level of care at this time or additional tests. Patient is advised to continue home symptomatic treatment. Patient is advised that if there are new or worsening symptoms to attend the emergency department, contact primary care provider, or return to UC. Instructions of care provided discharged home in stable condition.    THIS NOTE WAS GENERATED USING A VOICE RECOGNITION SOFTWARE PROGRAM.  ALL REASONABLE EFFORTS  WERE MADE TO PROOFREAD THIS DOCUMENT FOR ACCURACY.  I have verbally reviewed the discharge instructions with the patient. A printed AVS was given to the patient.  All questions were answered prior to discharge.      Tharon AquasFrank C Patrick, PA 04/21/15 1630

## 2015-04-21 NOTE — ED Notes (Signed)
Pt c/o persistent vag pain and dysuria onset x1 month... Has been to ED on 4/5 for similar sx w/labs done... Given flagyl w/no relief. Sx also include chills, BA, back pain.... A&O x4... No acute distress.

## 2015-04-22 ENCOUNTER — Emergency Department (HOSPITAL_COMMUNITY)
Admission: EM | Admit: 2015-04-22 | Discharge: 2015-04-22 | Disposition: A | Discharge status: Home / Self Care | Payer: Medicaid Other | Attending: Emergency Medicine | Admitting: Emergency Medicine

## 2015-04-22 ENCOUNTER — Encounter (HOSPITAL_COMMUNITY): Payer: Self-pay | Admitting: Emergency Medicine

## 2015-04-22 DIAGNOSIS — R6883 Chills (without fever): Secondary | ICD-10-CM | POA: Insufficient documentation

## 2015-04-22 DIAGNOSIS — N939 Abnormal uterine and vaginal bleeding, unspecified: Secondary | ICD-10-CM | POA: Insufficient documentation

## 2015-04-22 DIAGNOSIS — D649 Anemia, unspecified: Secondary | ICD-10-CM | POA: Insufficient documentation

## 2015-04-22 DIAGNOSIS — Z792 Long term (current) use of antibiotics: Secondary | ICD-10-CM | POA: Diagnosis not present

## 2015-04-22 DIAGNOSIS — R000 Tachycardia, unspecified: Secondary | ICD-10-CM | POA: Insufficient documentation

## 2015-04-22 LAB — CBC WITH DIFFERENTIAL/PLATELET
Basophils Absolute: 0 10*3/uL (ref 0.0–0.1)
Basophils Relative: 0 %
Eosinophils Absolute: 0 10*3/uL (ref 0.0–0.7)
Eosinophils Relative: 0 %
HEMATOCRIT: 34.9 % — AB (ref 36.0–46.0)
HEMOGLOBIN: 10.8 g/dL — AB (ref 12.0–15.0)
LYMPHS ABS: 1.7 10*3/uL (ref 0.7–4.0)
LYMPHS PCT: 22 %
MCH: 24.2 pg — AB (ref 26.0–34.0)
MCHC: 30.9 g/dL (ref 30.0–36.0)
MCV: 78.1 fL (ref 78.0–100.0)
MONO ABS: 0.7 10*3/uL (ref 0.1–1.0)
MONOS PCT: 8 %
NEUTROS ABS: 5.4 10*3/uL (ref 1.7–7.7)
NEUTROS PCT: 70 %
Platelets: 305 10*3/uL (ref 150–400)
RBC: 4.47 MIL/uL (ref 3.87–5.11)
RDW: 14.8 % (ref 11.5–15.5)
WBC: 7.8 10*3/uL (ref 4.0–10.5)

## 2015-04-22 LAB — URINALYSIS, ROUTINE W REFLEX MICROSCOPIC
Bilirubin Urine: NEGATIVE
GLUCOSE, UA: NEGATIVE mg/dL
KETONES UR: NEGATIVE mg/dL
LEUKOCYTES UA: NEGATIVE
Nitrite: NEGATIVE
PH: 5.5 (ref 5.0–8.0)
Protein, ur: NEGATIVE mg/dL
SPECIFIC GRAVITY, URINE: 1.022 (ref 1.005–1.030)

## 2015-04-22 LAB — BASIC METABOLIC PANEL
Anion gap: 9 (ref 5–15)
BUN: 10 mg/dL (ref 6–20)
CALCIUM: 9.2 mg/dL (ref 8.9–10.3)
CHLORIDE: 106 mmol/L (ref 101–111)
CO2: 24 mmol/L (ref 22–32)
CREATININE: 0.56 mg/dL (ref 0.44–1.00)
GFR calc Af Amer: >60 mL/min (ref >60)
GFR calc non Af Amer: >60 mL/min (ref >60)
GLUCOSE: 91 mg/dL (ref 65–99)
Potassium: 3.6 mmol/L (ref 3.5–5.1)
Sodium: 139 mmol/L (ref 135–145)

## 2015-04-22 LAB — URINE MICROSCOPIC-ADD ON

## 2015-04-22 MED ORDER — KETOROLAC TROMETHAMINE 30 MG/ML IJ SOLN
30.0000 mg | Freq: Once | INTRAMUSCULAR | Status: AC
  Administered 2015-04-22: 30 mg via INTRAVENOUS
  Filled 2015-04-22: qty 1

## 2015-04-22 MED ORDER — SODIUM CHLORIDE 0.9 % IV BOLUS (SEPSIS)
1000.0000 mL | Freq: Once | INTRAVENOUS | Status: AC
  Administered 2015-04-22: 1000 mL via INTRAVENOUS

## 2015-04-22 MED ORDER — KETOROLAC TROMETHAMINE 60 MG/2ML IM SOLN: 60.0000 mg | Freq: Once | INTRAMUSCULAR | Status: DC

## 2015-04-22 NOTE — ED Provider Notes (Signed)
CSN: [id number]     Arrival date & time 04/22/15  1129 History   First MD Initiated Contact with Patient 04/22/15 1209     Chief Complaint  Patient presents with  . Chills  . Vaginal Pain    HPI Comments: 30 year old female who presents with body chills since Thursday. Denies fever, URI symptoms, chest pain, SOB, cough, abdominal pain, N/V/D, dysuria, blood in urine. Pt is Spanish speaking, language line used. She was seen here three days ago and had a full STD work up which was negative. Wet prep showed Clue cells so she is currently being treated with Flagyl. She was also having breast pain at the time which has resolved. On Thursday she started to have body chills so she went to UC. They recommended supportive care with Tylenol which she states does work for a couple of hours but then the chills return. Today she presents because of continued chills. Her period started today and she is having some associated abdominal cramping. She is also expressing concerns about cervical cancer. Last Pap was 2015.    Patient is a 30 y.o. female presenting with vaginal pain. The history is provided by the patient. The history is limited by a language barrier. A language interpreter was used.  Vaginal Pain Associated symptoms include chills. Pertinent negatives include no fever.    History reviewed. No pertinent past medical history. Past Surgical History  Procedure Laterality Date  . Cesarean section      x3  . Cholecystectomy N/A 07/16/2014    Procedure: LAPAROSCOPIC CHOLECYSTECTOMY WITH INTRAOPERATIVE CHOLANGIOGRAM;  Surgeon: Manus Rudd, MD;  Location: [facility] OR;  Service: General;  Laterality: N/A;  . Endoscopic retrograde cholangiopancreatography (ercp) with propofol N/A 07/15/2014    Procedure: ENDOSCOPIC RETROGRADE CHOLANGIOPANCREATOGRAPHY (ERCP) WITH PROPOFOL;  Surgeon: Vida Rigger, MD;  Location: [facility] ENDOSCOPY;  Service: Endoscopy;  Laterality: N/A;   No family history on file. Social History   Substance Use Topics  . Smoking status: Never Smoker   . Smokeless tobacco: None  . Alcohol Use: No   OB History    No data available     Review of Systems  Constitutional: Positive for chills. Negative for fever.  Genitourinary: Positive for vaginal bleeding and vaginal pain. Negative for dysuria, vaginal discharge and pelvic pain.    Allergies  Review of patient's allergies indicates no known allergies.  Home Medications   Prior to Admission medications   Medication Sig Start Date End Date Taking? Authorizing Provider  metroNIDAZOLE (FLAGYL) 500 MG tablet Take 1 tablet (500 mg total) by mouth 2 (two) times daily. 04/19/15   Fayrene Helper, PA-C  traMADol (ULTRAM) 50 MG tablet Take 1 tablet (50 mg total) by mouth every 6 (six) hours as needed for moderate pain. 07/17/14   [name], PA-C   BP 122/77 mmHg  Pulse 107  Temp(Src) 98.1 F (36.7 C) (Oral)  Resp 18  SpO2 100%  LMP 04/22/2015   Physical Exam  Constitutional: She is oriented to person, place, and time. She appears well-developed and well-nourished. No distress.  HENT:  Head: Normocephalic and atraumatic.  Eyes: Conjunctivae are normal. Pupils are equal, round, and reactive to light. Right eye exhibits no discharge. Left eye exhibits no discharge. No scleral icterus.  Neck: Normal range of motion.  Cardiovascular: Regular rhythm.  Tachycardia present.   Pulmonary/Chest: Effort normal. No respiratory distress. She has no wheezes. She has no rales. She exhibits no tenderness.  Abdominal: Soft. She exhibits no distension  and no mass. There is no tenderness. There is no rigidity, no rebound, no guarding and no CVA tenderness.  Genitourinary:  Chaperone present during exam. No inguinal lymphadenopathy or inguinal hernia noted. Normal external genitalia. No pain with speculum insertion. Close cervical os. Blood oozing from cervix and pooling in vagina. Os with normal appearance without rash or lesions. Normal vaginal wall  mucosa. On bimanual examination no adnexal tenderness or cervical motion tenderness.   Neurological: She is alert and oriented to person, place, and time.  Skin: Skin is warm and dry.  Psychiatric: She has a normal mood and affect.    ED Course  Procedures (including critical care time) Labs Review Labs Reviewed  URINALYSIS, ROUTINE W REFLEX MICROSCOPIC (NOT AT [facility]) - Abnormal; Notable for the following:    Hgb urine dipstick MODERATE (*)    All other components within normal limits  CBC WITH DIFFERENTIAL/PLATELET - Abnormal; Notable for the following:    Hemoglobin 10.8 (*)    HCT 34.9 (*)    MCH 24.2 (*)    All other components within normal limits  URINE MICROSCOPIC-ADD ON - Abnormal; Notable for the following:    Squamous Epithelial / LPF 0-5 (*)    Bacteria, UA RARE (*)    All other components within normal limits  BASIC METABOLIC PANEL    Imaging Review No results found. I have personally reviewed and evaluated these images and lab results as part of my medical decision-making.   EKG Interpretation None      MDM   Final diagnoses:  Chills without fever   30 year old female who presents with body chills for 2 days. Her labs are unremarkable, some mild anemia and RBCs in her UA, however she is on her period. Notified patient about STD results. Educated patient about how Pap smear is not the same as the STD screening that was done here and she still needs to have that done at either her PCP or OBGYN. Pt verbalized understanding.  Gave Toradol and IVF. Pt feels improved after observation and/or treatment in ED. Advised her to take NSAIDs as needed as her symptoms are most likely some sort of viral illness. She is non-toxic, NAD, with stable vitals. She is not tachycardic on repeat exam. Return precautions given.    Bethel BornKelly Marie Shantoria Ellwood, PA-C 04/22/15 1533  Richardean Canalavid H Yao, MD 04/22/15 1537

## 2015-04-22 NOTE — ED Notes (Signed)
Pt c/o chills onset Tuesday and vaginal pain x 1 month. Pt seen at urgent care for same yesterday. Pt also was here 3 days ago. Pt reports brown vaginal discharge.

## 2015-04-26 ENCOUNTER — Encounter: Payer: Self-pay | Admitting: Internal Medicine

## 2015-04-26 ENCOUNTER — Ambulatory Visit: Payer: Medicaid Other | Attending: Internal Medicine | Admitting: Internal Medicine

## 2015-04-26 ENCOUNTER — Encounter: Payer: Self-pay | Admitting: Clinical

## 2015-04-26 VITALS — BP 124/86 | HR 75 | Temp 98.0°F | Resp 18 | Ht 63.0 in | Wt 140.0 lb

## 2015-04-26 DIAGNOSIS — J302 Other seasonal allergic rhinitis: Secondary | ICD-10-CM | POA: Insufficient documentation

## 2015-04-26 DIAGNOSIS — R300 Dysuria: Secondary | ICD-10-CM | POA: Diagnosis not present

## 2015-04-26 DIAGNOSIS — D509 Iron deficiency anemia, unspecified: Secondary | ICD-10-CM

## 2015-04-26 DIAGNOSIS — R6883 Chills (without fever): Secondary | ICD-10-CM | POA: Insufficient documentation

## 2015-04-26 DIAGNOSIS — Z79899 Other long term (current) drug therapy: Secondary | ICD-10-CM | POA: Insufficient documentation

## 2015-04-26 LAB — POCT URINALYSIS DIPSTICK
Bilirubin, UA: NEGATIVE
Glucose, UA: NEGATIVE
Ketones, UA: NEGATIVE
Leukocytes, UA: NEGATIVE
NITRITE UA: NEGATIVE
PH UA: 6
SPEC GRAV UA: 1.02
UROBILINOGEN UA: 0.2

## 2015-04-26 LAB — TSH: TSH: 2.85 m[IU]/L

## 2015-04-26 MED ORDER — LORATADINE 10 MG PO TABS: 10.0000 mg | ORAL_TABLET | Freq: Every day | ORAL | Status: DC

## 2015-04-26 MED ORDER — FERROUS GLUCONATE 324 (38 FE) MG PO TABS: 324.0000 mg | ORAL_TABLET | Freq: Two times a day (BID) | ORAL | Status: DC

## 2015-04-26 MED ORDER — SENNA 8.6 MG PO TABS: 1.0000 | ORAL_TABLET | Freq: Every day | ORAL | Status: DC | PRN

## 2015-04-26 NOTE — Progress Notes (Signed)
Patient is here to Establish Care  Patient denies pain at this time.  Patient only took Flagyl for 4 days due to becoming "jittery". Patient denies any SOB or tachycardia.  Patient has not taken any medications nor has she eaten today.

## 2015-04-26 NOTE — Progress Notes (Signed)
Debra Blackburn, is a 30 y.o. female  ZOX:[id number]  [id number]:[id number]  DOB - [birth year]  CC:  Chief Complaint  Patient presents with  . Establish Care       HPI: Debra Blackburn is a 30 y.o. female here today to establish medical care.  First time in our clinic.  Recent ED visits on4/5/17 for BV infection (treated w/ Flagyl), and 4/8 ED for chills, thought viral syndrome.  Here to establish care.  Since ED visits, chills are less, but seems to occur more at nights.  She worries about this, feels "cold all the time".  Also, c/o of constant dysuria, not related to sexual intercourse.  Just finished her period yesterday, now just spotting a bit.  She has OBgyn appt for 4/18 for papsmear, which she wanted to keep.  Also c/o of scratchy throat and her right ear "irritates" her at times.  Pt denies any nasal drainage or congestion.    Spanish interpreter via ipad used. Pt's young son present during exam, playing on phone.   Patient has No headache, No chest pain, No abdominal pain - No Nausea, No new weakness tingling or numbness, No Cough - SOB.  No Known Allergies History reviewed. No pertinent past medical history. Current Outpatient Prescriptions on File Prior to Visit  Medication Sig Dispense Refill  . acetaminophen (TYLENOL) 325 MG tablet Take 650 mg by mouth every 6 (six) hours as needed for mild pain.    . metroNIDAZOLE (FLAGYL) 500 MG tablet Take 1 tablet (500 mg total) by mouth 2 (two) times daily. (Patient not taking: Reported on 04/26/2015) 14 tablet 0   No current facility-administered medications on file prior to visit.   History reviewed. No pertinent family history. Social History   Social History  . Marital Status: Single    Spouse Name: N/A  . Number of Children: N/A  . Years of Education: N/A   Occupational History  . Not on file.   Social History Main Topics  . Smoking status: Never Smoker   . Smokeless tobacco: Not on file  . Alcohol Use: No  .  Drug Use: No  . Sexual Activity: Not on file   Other Topics Concern  . Not on file   Social History Narrative   surg hx: tubal ligation  Review of Systems: Constitutional: Negative for fever, diaphoresis, activity change, appetite change and fatigue.   +chills, at her baseline weight, some minor weightloss due to better diet. HENT: Negative for ear pain, nosebleeds, congestion, facial swelling, rhinorrhea, neck pain, neck stiffness and ear discharge.  Eyes: Negative for pain, discharge, redness, itching and visual disturbance.  +right ear irritates her/scratchy throat Respiratory: Negative for cough, choking, chest tightness, shortness of breath, wheezing and stridor.  Cardiovascular: Negative for chest pain, palpitations and leg swelling. Gastrointestinal: Negative for abdominal distention. Genitourinary: Negative for  urgency, frequency, hematuria, flank pain, decreased urine volume, difficulty urinating and dyspareunia.   + dysuria, finishing her period now Musculoskeletal: Negative for back pain, joint swelling, arthralgia and gait problem. Neurological: Negative for dizziness, tremors, seizures, syncope, facial asymmetry, speech difficulty, weakness, light-headedness, numbness and headaches.  Hematological: Negative for adenopathy. Does not bruise/bleed easily. Psychiatric/Behavioral: Negative for hallucinations, behavioral problems, confusion, dysphoric mood, decreased concentration and agitation.    Objective:   Filed Vitals:   04/26/15 1044  BP: 124/86  Pulse: 75  Temp: 98 F (36.7 C)  Resp: 18    Physical Exam: Constitutional: Patient appears well-developed and well-nourished. No distress. AAOx3 HENT:  Normocephalic, atraumatic, External right and left ear normal. bilat TMs clear;  Oropharynx is clear and moist, no exudate/cobblestoning; no nasal congestion noted bilat.  Eyes: Conjunctivae and EOM are normal. PERRL, no scleral icterus. Neck: Normal ROM. Neck supple. No  JVD.No thyromegaly. CVS: RRR, S1/S2 +, no murmurs, no gallops, no carotid bruit.  Pulmonary: Effort and breath sounds normal, no stridor, rhonchi, wheezes, rales.  Abdominal: Soft. Obese, BS +, no distension, tenderness, rebound or guarding.  Musculoskeletal: Normal range of motion. No edema and no tenderness.  LE: bilat/ no c/c/e, pulses 2+ bilateral. Lymphadenopathy: No lymphadenopathy noted, cervical Neuro: Alert.  muscle tone coordination. No cranial nerve deficit grossly. Skin: Skin is warm and dry. No rash noted. Not diaphoretic. No erythema. No pallor. Psychiatric: Normal mood and affect. Behavior, judgment, thought content normal.  Lab Results  Component Value Date   WBC 7.8 04/22/2015   HGB 10.8* 04/22/2015   HCT 34.9* 04/22/2015   MCV 78.1 04/22/2015   PLT 305 04/22/2015   Lab Results  Component Value Date   CREATININE 0.56 04/22/2015   BUN 10 04/22/2015   NA 139 04/22/2015   K 3.6 04/22/2015   CL 106 04/22/2015   CO2 24 04/22/2015    No results found for: HGBA1C Lipid Panel  No results found for: CHOL, TRIG, HDL, CHOLHDL, VLDL, LDLCALC     Assessment and plan:   1. Microcytic anemia, in setting of menses. - iron bid, high fiber, stool softeners if need  2. Chills (without fever), ? To anemia - Urinalysis Dipstick  - some blood (mentrual), ow unremarkable - TSH  3. Seasonal allergies Trial claritin  4. Dysuria - Urinalysis Dipstick - neg  5. papsmear - pt wants to have it done w/ OBgyn on 4/18, consider ocp for mennorrhagia.   Return in about 2 months (around 06/26/2015).  The patient was given clear instructions to go to ER or return to medical center if symptoms don't improve, worsen or new problems develop. The patient verbalized understanding. The patient was told to call to get lab results if they haven't heard anything in the next week.      Pete Glatterawn T Paizlie Klaus, MD, MBA/MHA [facility] [place],  [place] [phone]   04/26/2015, 11:13 AM

## 2015-04-26 NOTE — Patient Instructions (Signed)
Anemia por deficiencia de hierro - Adultos (Iron Deficiency Anemia, Adult) La anemia sucede cuando la cantidad de glbulos rojos sanos es baja. Con frecuencia, la causa es una cantidad insuficiente de hierro. Esto se denomina anemia por deficiencia de hierro y puede provocarle cansancio y dificultad para respirar. CUIDADOS EN EL HOGAR   Tome el hierro segn las indicaciones del mdico.  Tome las vitaminas segn las indicaciones del mdico.  Consuma alimentos que contengan hierro. Estos incluyen hgado, carne magra, pan de grano integral, huevos, frutas deshidratadas y vegetales de hojas color verde oscuro. SOLICITE AYUDA DE INMEDIATO SI:  Pierde el conocimiento (se desmaya).  Siente dolor en el pecho.  Tiene malestar estomacal (nuseas) o vomita.  Tiene mucha dificultad para respirar cuando realiza actividades.  Se siente dbil.  La frecuencia cardaca est acelerada.  Comienza a sudar sin motivo.  Se siente mareado cuando se levanta de una silla o de la cama. ASEGRESE DE QUE:  Comprende estas instrucciones.  Controlar su afeccin.  Recibir ayuda de inmediato si no mejora o si empeora.   Esta informacin no tiene Theme park managercomo fin reemplazar el consejo del mdico. Asegrese de hacerle al mdico cualquier pregunta que tenga.   Document Released: 04/06/2010 Document Revised: 05/17/2014 Elsevier Interactive Patient Education [employer]2016 Elsevier Inc.

## 2015-04-26 NOTE — Progress Notes (Signed)
Depression screen [facility] 2/9 04/26/2015  Decreased Interest 0  Down, Depressed, Hopeless 0  PHQ - 2 Score 0    GAD 7 : Generalized Anxiety Score 04/26/2015  Nervous, Anxious, on Edge 3  Control/stop worrying 2  Worry too much - different things 3  Trouble relaxing 2  Restless 3  Easily annoyed or irritable 1  Afraid - awful might happen 3  Total GAD 7 Score 17

## 2015-05-02 ENCOUNTER — Other Ambulatory Visit: Payer: Self-pay | Admitting: *Deleted

## 2015-05-02 ENCOUNTER — Other Ambulatory Visit (HOSPITAL_COMMUNITY)
Admission: RE | Admit: 2015-05-02 | Discharge: 2015-05-02 | Disposition: A | Discharge status: Home / Self Care | Payer: Medicaid Other | Source: Ambulatory Visit | Attending: Obstetrics and Gynecology | Admitting: Obstetrics and Gynecology

## 2015-05-02 ENCOUNTER — Encounter: Payer: Self-pay | Admitting: Obstetrics and Gynecology

## 2015-05-02 ENCOUNTER — Ambulatory Visit (INDEPENDENT_AMBULATORY_CARE_PROVIDER_SITE_OTHER): Payer: Medicaid Other | Admitting: Obstetrics and Gynecology

## 2015-05-02 VITALS — BP 112/75 | HR 103 | Resp 18 | Ht 63.0 in | Wt 138.0 lb

## 2015-05-02 DIAGNOSIS — R300 Dysuria: Secondary | ICD-10-CM | POA: Diagnosis not present

## 2015-05-02 DIAGNOSIS — N760 Acute vaginitis: Secondary | ICD-10-CM | POA: Diagnosis not present

## 2015-05-02 DIAGNOSIS — Z124 Encounter for screening for malignant neoplasm of cervix: Secondary | ICD-10-CM

## 2015-05-02 DIAGNOSIS — Z01419 Encounter for gynecological examination (general) (routine) without abnormal findings: Secondary | ICD-10-CM | POA: Insufficient documentation

## 2015-05-02 NOTE — Progress Notes (Signed)
   Subjective:    Patient ID: Debra Blackburn, female    DOB: 08/22/1985, 30 y.o.   MRN: [id number]  HPI 30 yo with BMI 24.5 presenting today for the evaluation of vulvovaginitis and a pap smear. Patient was diagnosed with BV on 04/19/2015 but did not complete the prescribed Flagyl. Patient reports persistent vaginal discomfort. She denies any abnormal discharge or odor. She has been experiencing some dysuria which is slowly improving. Patient is also in need of a pap smear. She had a routine physical with her PCP last week with negative pelvic cultures. Patient is sexually active using BTL for contraception. She reports monthly 5-day cycles.  Past Medical History  Diagnosis Date  . Acid reflux    Past Surgical History  Procedure Laterality Date  . Cesarean section      x3  . Cholecystectomy N/A 07/16/2014    Procedure: LAPAROSCOPIC CHOLECYSTECTOMY WITH INTRAOPERATIVE CHOLANGIOGRAM;  Surgeon: Manus RuddMatthew Tsuei, MD;  Location: [facility] OR;  Service: General;  Laterality: N/A;  . Endoscopic retrograde cholangiopancreatography (ercp) with propofol N/A 07/15/2014    Procedure: ENDOSCOPIC RETROGRADE CHOLANGIOPANCREATOGRAPHY (ERCP) WITH PROPOFOL;  Surgeon: Vida RiggerMarc Magod, MD;  [facility];  Service: Endoscopy;  Laterality: N/A;   Family History  Problem Relation Age of Onset  . Hypertension Mother   . Diabetes Father    Social History  Substance Use Topics  . Smoking status: Never Smoker   . Smokeless tobacco: None  . Alcohol Use: No      Review of Systems See pertinent in HPI    Objective:   Physical Exam  GENERAL: Well-developed, well-nourished female in no acute distress.  ABDOMEN: Soft, nontender, nondistended. No organomegaly. PELVIC: Normal external female genitalia. Vagina is pink and rugated.  Normal discharge. Normal appearing cervix. Uterus is normal in size. No adnexal mass or tenderness. EXTREMITIES: No cyanosis, clubbing, or edema, 2+ distal pulses.     Assessment &  Plan:  30 yo with vulvovaginits - pap smear collected - wet prep collected - Urine culture sent - Patient will be contacted with any abnormal results - RTC prn

## 2015-05-03 LAB — WET PREP, GENITAL
TRICH WET PREP: NONE SEEN
WBC, Wet Prep HPF POC: NONE SEEN
Yeast Wet Prep HPF POC: NONE SEEN

## 2015-05-04 ENCOUNTER — Telehealth: Payer: Self-pay | Admitting: *Deleted

## 2015-05-04 LAB — CYTOLOGY - PAP

## 2015-05-04 MED ORDER — METRONIDAZOLE 500 MG PO TABS: 500.0000 mg | ORAL_TABLET | Freq: Two times a day (BID) | ORAL | Status: DC

## 2015-05-04 NOTE — Addendum Note (Signed)
Addended by: Catalina AntiguaONSTANT, Keilee Denman on: 05/04/2015 09:16 AM   Modules accepted: Orders

## 2015-05-04 NOTE — Telephone Encounter (Signed)
Informed pt of result and that medication had been sent to the pharmacy.  Spoke to pt via interpreter line, pt acknowledged instructions.

## 2015-05-24 ENCOUNTER — Telehealth: Payer: Self-pay | Admitting: *Deleted

## 2015-05-24 NOTE — Telephone Encounter (Signed)
Medical Assistant used [employer] to contact patient.  Interpreter Name: Interpreter #: [id number]  Patient verified DOB Patient is aware of thyroid being normal and not the cause of her weight gain. Patient advised to limit her fast food intake to assist in weight loss. No further questions at this time.

## 2015-05-24 NOTE — Telephone Encounter (Signed)
-----   Message from Pete Glatterawn T Langeland, MD sent at 05/01/2015  2:27 PM EDT ----- Please all patient that her thyroid test was normal, so not cause of weight gain.  Ask her to watch the food intake, and reduce fast food/takeout and fatty foods. Thanks.

## 2015-06-04 ENCOUNTER — Encounter (HOSPITAL_COMMUNITY): Payer: Self-pay | Admitting: *Deleted

## 2015-06-04 DIAGNOSIS — R000 Tachycardia, unspecified: Secondary | ICD-10-CM | POA: Insufficient documentation

## 2015-06-04 DIAGNOSIS — R509 Fever, unspecified: Secondary | ICD-10-CM | POA: Insufficient documentation

## 2015-06-04 DIAGNOSIS — Z8719 Personal history of other diseases of the digestive system: Secondary | ICD-10-CM | POA: Insufficient documentation

## 2015-06-04 DIAGNOSIS — Z3202 Encounter for pregnancy test, result negative: Secondary | ICD-10-CM | POA: Diagnosis not present

## 2015-06-04 DIAGNOSIS — R300 Dysuria: Secondary | ICD-10-CM | POA: Diagnosis not present

## 2015-06-04 DIAGNOSIS — R1031 Right lower quadrant pain: Secondary | ICD-10-CM | POA: Diagnosis present

## 2015-06-04 DIAGNOSIS — Z9049 Acquired absence of other specified parts of digestive tract: Secondary | ICD-10-CM | POA: Insufficient documentation

## 2015-06-04 DIAGNOSIS — R110 Nausea: Secondary | ICD-10-CM | POA: Insufficient documentation

## 2015-06-04 LAB — CBC
HEMATOCRIT: 38.4 % (ref 36.0–46.0)
Hemoglobin: 11.7 g/dL — ABNORMAL LOW (ref 12.0–15.0)
MCH: 23.9 pg — ABNORMAL LOW (ref 26.0–34.0)
MCHC: 30.5 g/dL (ref 30.0–36.0)
MCV: 78.5 fL (ref 78.0–100.0)
PLATELETS: 289 10*3/uL (ref 150–400)
RBC: 4.89 MIL/uL (ref 3.87–5.11)
RDW: 15 % (ref 11.5–15.5)
WBC: 9.7 10*3/uL (ref 4.0–10.5)

## 2015-06-04 NOTE — ED Notes (Signed)
Pt states right sided abdominal pain, nausea, vomiting, fevers and pain with urination x 1 week.

## 2015-06-05 ENCOUNTER — Emergency Department (HOSPITAL_COMMUNITY)
Admission: EM | Admit: 2015-06-05 | Discharge: 2015-06-05 | Disposition: A | Discharge status: Home / Self Care | Payer: Medicaid Other | Attending: Emergency Medicine | Admitting: Emergency Medicine

## 2015-06-05 ENCOUNTER — Emergency Department (HOSPITAL_COMMUNITY): Payer: Medicaid Other

## 2015-06-05 DIAGNOSIS — R300 Dysuria: Secondary | ICD-10-CM

## 2015-06-05 DIAGNOSIS — R1030 Lower abdominal pain, unspecified: Secondary | ICD-10-CM

## 2015-06-05 LAB — RPR: RPR: NONREACTIVE

## 2015-06-05 LAB — COMPREHENSIVE METABOLIC PANEL
ALK PHOS: 80 U/L (ref 38–126)
ALT: 18 U/L (ref 14–54)
AST: 25 U/L (ref 15–41)
Albumin: 4.1 g/dL (ref 3.5–5.0)
Anion gap: 8 (ref 5–15)
BILIRUBIN TOTAL: 0.3 mg/dL (ref 0.3–1.2)
BUN: 10 mg/dL (ref 6–20)
CALCIUM: 9.4 mg/dL (ref 8.9–10.3)
CO2: 26 mmol/L (ref 22–32)
CREATININE: 0.67 mg/dL (ref 0.44–1.00)
Chloride: 102 mmol/L (ref 101–111)
Glucose, Bld: 103 mg/dL — ABNORMAL HIGH (ref 65–99)
Potassium: 3.3 mmol/L — ABNORMAL LOW (ref 3.5–5.1)
Sodium: 136 mmol/L (ref 135–145)
TOTAL PROTEIN: 8.4 g/dL — AB (ref 6.5–8.1)

## 2015-06-05 LAB — URINALYSIS, ROUTINE W REFLEX MICROSCOPIC
BILIRUBIN URINE: NEGATIVE
Glucose, UA: NEGATIVE mg/dL
Hgb urine dipstick: NEGATIVE
KETONES UR: NEGATIVE mg/dL
Leukocytes, UA: NEGATIVE
NITRITE: NEGATIVE
Protein, ur: NEGATIVE mg/dL
Specific Gravity, Urine: 1.024 (ref 1.005–1.030)
pH: 5.5 (ref 5.0–8.0)

## 2015-06-05 LAB — WET PREP, GENITAL
SPERM: NONE SEEN
TRICH WET PREP: NONE SEEN
Yeast Wet Prep HPF POC: NONE SEEN

## 2015-06-05 LAB — LIPASE, BLOOD: LIPASE: 23 U/L (ref 11–51)

## 2015-06-05 LAB — HIV ANTIBODY (ROUTINE TESTING W REFLEX): HIV SCREEN 4TH GENERATION: NONREACTIVE

## 2015-06-05 LAB — GC/CHLAMYDIA PROBE AMP (~~LOC~~) NOT AT ARMC
CHLAMYDIA, DNA PROBE: NEGATIVE
NEISSERIA GONORRHEA: NEGATIVE

## 2015-06-05 LAB — POC URINE PREG, ED: PREG TEST UR: NEGATIVE

## 2015-06-05 MED ORDER — IOPAMIDOL (ISOVUE-300) INJECTION 61%
INTRAVENOUS | Status: AC
  Administered 2015-06-05: 100 mL
  Filled 2015-06-05: qty 100

## 2015-06-05 MED ORDER — POTASSIUM CHLORIDE CRYS ER 20 MEQ PO TBCR
40.0000 meq | EXTENDED_RELEASE_TABLET | Freq: Once | ORAL | Status: AC
Start: 2015-06-05 — End: 2015-06-05
  Administered 2015-06-05: 40 meq via ORAL
  Filled 2015-06-05: qty 2

## 2015-06-05 MED ORDER — SODIUM CHLORIDE 0.9 % IV BOLUS (SEPSIS)
500.0000 mL | Freq: Once | INTRAVENOUS | Status: AC
Start: 2015-06-05 — End: 2015-06-05
  Administered 2015-06-05: 500 mL via INTRAVENOUS

## 2015-06-05 MED ORDER — SODIUM CHLORIDE 0.9 % IV BOLUS (SEPSIS)
1000.0000 mL | Freq: Once | INTRAVENOUS | Status: AC
  Administered 2015-06-05: 1000 mL via INTRAVENOUS

## 2015-06-05 MED ORDER — HYDROCODONE-ACETAMINOPHEN 5-325 MG PO TABS: 1.0000 | ORAL_TABLET | Freq: Three times a day (TID) | ORAL | Status: AC | PRN

## 2015-06-05 NOTE — Discharge Instructions (Signed)
1. Medications: vicodin, usual home medications 2. Treatment: rest, drink plenty of fluids, advance diet slowly 3. Follow Up: Please followup with OB/GYN and Urology at your already scheduled appointment for discussion of your diagnoses and further evaluation after today's visit; Please return to the ER for persistent vomiting, high fevers or worsening symptoms

## 2015-06-05 NOTE — ED Provider Notes (Signed)
CSN: [id number]     Arrival date & time 06/04/15  2313 History   First MD Initiated Contact with Patient 06/05/15 0105     Chief Complaint  Patient presents with  . Abdominal Pain     (Consider location/radiation/quality/duration/timing/severity/associated sxs/prior Treatment) The history is provided by the patient and medical records. No language interpreter was used.     Debra Blackburn is a 30 y.o. female  [phone] with a hx of GERD, BTL presents to the Emergency Department complaining of gradual, persistent, progressively worsening RLQ abd pain onset 3 days ago with associated subjective fevers and nausea without vomiting. Pt denies recent travel or sick contacts.  She reports new vaginal discharge but has a hx of BV.  She reports this is similar.  She also reports 3 mos of dysuria but reports that previous w/u by PCP has been negative.  She has not been seen by urology, But does have an appointment for early June.  Pt reports taking tylenol for the fever with some relief.  This also helps with the pain.  She reports the pain is sharp and rated at a 7/10.  Nothing makes the pain worse.  Pt denies headache, neck pain, chest pain, SOB, weakness, dizziness, syncope.      Past Medical History  Diagnosis Date  . Acid reflux    Past Surgical History  Procedure Laterality Date  . Cesarean section      x3  . Cholecystectomy N/A 07/16/2014    Procedure: LAPAROSCOPIC CHOLECYSTECTOMY WITH INTRAOPERATIVE CHOLANGIOGRAM;  Surgeon: Manus Rudd, MD;  Location: [facility] OR;  Service: General;  Laterality: N/A;  . Endoscopic retrograde cholangiopancreatography (ercp) with propofol N/A 07/15/2014    Procedure: ENDOSCOPIC RETROGRADE CHOLANGIOPANCREATOGRAPHY (ERCP) WITH PROPOFOL;  Surgeon: Vida Rigger, MD;  Location: [facility]OSCOPY;  Service: Endoscopy;  Laterality: N/A;   Family History  Problem Relation Age of Onset  . Hypertension Mother   . Diabetes Father    Social History  Substance Use Topics  .  Smoking status: Never Smoker   . Smokeless tobacco: None  . Alcohol Use: No   OB History    No data available     Review of Systems  Constitutional: Positive for fever and chills. Negative for diaphoresis, appetite change, fatigue and unexpected weight change.  HENT: Negative for mouth sores.   Eyes: Negative for visual disturbance.  Respiratory: Negative for cough, chest tightness, shortness of breath and wheezing.   Cardiovascular: Negative for chest pain.  Gastrointestinal: Positive for nausea and abdominal pain. Negative for vomiting, diarrhea and constipation.  Endocrine: Negative for polydipsia, polyphagia and polyuria.  Genitourinary: Positive for dysuria. Negative for urgency, frequency and hematuria.  Musculoskeletal: Negative for back pain and neck stiffness.  Skin: Negative for rash.  Allergic/Immunologic: Negative for immunocompromised state.  Neurological: Negative for syncope, light-headedness and headaches.  Hematological: Does not bruise/bleed easily.  Psychiatric/Behavioral: Negative for sleep disturbance. The patient is not nervous/anxious.       Allergies  Review of patient's allergies indicates no known allergies.  Home Medications   Prior to Admission medications   Medication Sig Start Date End Date Taking? Authorizing Provider  HYDROcodone-acetaminophen (NORCO/VICODIN) 5-325 MG tablet Take 1 tablet by mouth every 8 (eight) hours as needed for moderate pain or severe pain. 06/05/15   Dillinger Aston, PA-C   BP 112/68 mmHg  Pulse 97  Temp(Src) 98.1 F (36.7 C) (Oral)  Resp 16  Ht  (1.6 m)  Wt 64.864 kg  BMI 25.34 kg/m2  SpO2 100%  LMP 05/20/2015 Physical Exam  Constitutional: She appears well-developed and well-nourished. No distress.  Awake, alert, nontoxic appearance  HENT:  Head: Normocephalic and atraumatic.  Mouth/Throat: Oropharynx is clear and moist. No oropharyngeal exudate.  Eyes: Conjunctivae are normal. No scleral icterus.   Neck: Normal range of motion. Neck supple.  Cardiovascular: Regular rhythm, normal heart sounds and intact distal pulses.  Tachycardia present.   Pulses:      Radial pulses are 2+ on the right side, and 2+ on the left side.  Pulmonary/Chest: Effort normal and breath sounds normal. No respiratory distress. She has no wheezes.  Equal chest expansion  Abdominal: Soft. Bowel sounds are normal. She exhibits no distension and no mass. There is tenderness in the right lower quadrant and suprapubic area. There is no rigidity, no rebound, no guarding, no CVA tenderness and negative Murphy's sign.  Well healed low, transverse c-section   Musculoskeletal: Normal range of motion. She exhibits no edema.  Neurological: She is alert.  Speech is clear and goal oriented Moves extremities without ataxia  Skin: Skin is warm and dry. She is not diaphoretic.  Psychiatric: She has a normal mood and affect.  Nursing note and vitals reviewed.   ED Course  Procedures (including critical care time) Labs Review Labs Reviewed  WET PREP, GENITAL - Abnormal; Notable for the following:    Clue Cells Wet Prep HPF POC PRESENT (*)    WBC, Wet Prep HPF POC FEW (*)    All other components within normal limits  COMPREHENSIVE METABOLIC PANEL - Abnormal; Notable for the following:    Potassium 3.3 (*)    Glucose, Bld 103 (*)    Total Protein 8.4 (*)    All other components within normal limits  CBC - Abnormal; Notable for the following:    Hemoglobin 11.7 (*)    MCH 23.9 (*)    All other components within normal limits  URINALYSIS, ROUTINE W REFLEX MICROSCOPIC (NOT AT [facility]) - Abnormal; Notable for the following:    APPearance HAZY (*)    All other components within normal limits  LIPASE, BLOOD  RPR  HIV ANTIBODY (ROUTINE TESTING)  POC URINE PREG, ED  GC/CHLAMYDIA PROBE AMP ([place]) NOT AT [facility]    Imaging Review Ct Abdomen Pelvis W Contrast  06/05/2015  CLINICAL DATA:  Right lower quadrant abdominal  pain. Subjective fever. EXAM: CT ABDOMEN AND PELVIS WITH CONTRAST TECHNIQUE: Multidetector CT imaging of the abdomen and pelvis was performed using the standard protocol following bolus administration of intravenous contrast. CONTRAST:  ISOVUE-300 IOPAMIDOL (ISOVUE-300) INJECTION 61% COMPARISON:  None. FINDINGS: Lower chest: Subpleural 4 mm calcified nodule in the left lower lobe, sequela of prior granulomatous disease. Mild breathing motion artifact. Liver: No focal lesion. Hepatobiliary: Clips in the gallbladder fossa postcholecystectomy. No biliary dilatation. Pancreas: No ductal dilatation or inflammation. Spleen: Grossly normal, mild motion artifact. Adrenal glands: No nodule. Kidneys: Symmetric renal enhancement. No hydronephrosis. Tiny cortical hypodensity in the lower left kidney Ing, too small to accurately characterize. Stomach/Bowel: Stomach physiologically distended. There are no dilated or thickened small bowel loops. Small to moderate volume of stool throughout the colon without colonic wall thickening. There is stool distending the rectum. The appendix is normal. Vascular/Lymphatic: No retroperitoneal adenopathy. Abdominal aorta is normal in caliber. Reproductive: Prominent periuterine and adnexal vascularity with dilatation of the ovarian veins. Ovaries symmetric in size. Small amount pelvic free fluid is physiologic. Bladder: Minimally distended, no evidence of wall thickening. Other: No free  air or intra-abdominal fluid collection. Musculoskeletal: There are no acute or suspicious osseous abnormalities. IMPRESSION: 1. Normal appendix. 2. Prominent periuterine and adnexal vascularity with dilatation of the ovarian veins, can be seen with pelvic congestion syndrome. Electronically Signed   By: Rubye OaksMelanie  Ehinger M.D.   On: 06/05/2015 03:21   I have personally reviewed and evaluated these images and lab results as part of my medical decision-making.    MDM   Final diagnoses:  Lower  abdominal pain  Dysuria   Debra Blackburn presents with several months worth of dysuria. Patient with worsening abdominal pain for the last several days. CT scan shows normal appendix and evidence of pelvic congestion syndrome.  Pelvic exam is without evidence of infection. No cervical motion tenderness or adnexal tenderness.  Patient initially with tachycardia on arrival this is improved after fluids and pain control.  Labs are reassuring. Mild hypokalemia; repleted in the emergency department.  No evidence of urinary tract infection. No leukocytosis.  At this time and there is no evidence of emergent findings. Concerning the patient's persistent pain is likely to subacute etiology such as interstitial cystitis.  Patient has follow-up with urology in several weeks. We'll discharge home with pain control. Patient does not meet the SIRS or Sepsis criteria.  On repeat exam patient does not have a surgical abdomin and there are no peritoneal signs.  No indication of appendicitis, bowel obstruction, bowel perforation, cholecystitis, diverticulitis, PID or ectopic pregnancy.  Patient discharged home with symptomatic treatment and given strict instructions for follow-up with their primary care physician.  I have also discussed reasons to return immediately to the ER.  Patient expresses understanding and agrees with plan.       Dahlia ClientHannah Jacquelynn Friend, PA-C 06/05/15 [id number]  Layla MawKristen N Ward, DO 06/05/15 [phone]

## 2015-06-05 NOTE — ED Notes (Signed)
EDP at bedside  

## 2015-06-13 ENCOUNTER — Emergency Department (HOSPITAL_COMMUNITY): Payer: Medicaid Other

## 2015-06-13 ENCOUNTER — Encounter (HOSPITAL_COMMUNITY): Payer: Self-pay | Admitting: Emergency Medicine

## 2015-06-13 ENCOUNTER — Emergency Department (HOSPITAL_COMMUNITY)
Admission: EM | Admit: 2015-06-13 | Discharge: 2015-06-13 | Disposition: A | Discharge status: Home / Self Care | Payer: Medicaid Other | Attending: Emergency Medicine | Admitting: Emergency Medicine

## 2015-06-13 ENCOUNTER — Other Ambulatory Visit: Payer: Self-pay

## 2015-06-13 DIAGNOSIS — R300 Dysuria: Secondary | ICD-10-CM | POA: Insufficient documentation

## 2015-06-13 DIAGNOSIS — J020 Streptococcal pharyngitis: Secondary | ICD-10-CM | POA: Insufficient documentation

## 2015-06-13 DIAGNOSIS — N644 Mastodynia: Secondary | ICD-10-CM | POA: Diagnosis not present

## 2015-06-13 DIAGNOSIS — J029 Acute pharyngitis, unspecified: Secondary | ICD-10-CM

## 2015-06-13 LAB — URINALYSIS, ROUTINE W REFLEX MICROSCOPIC
BILIRUBIN URINE: NEGATIVE
GLUCOSE, UA: NEGATIVE mg/dL
HGB URINE DIPSTICK: NEGATIVE
Ketones, ur: NEGATIVE mg/dL
Leukocytes, UA: NEGATIVE
NITRITE: NEGATIVE
Protein, ur: NEGATIVE mg/dL
SPECIFIC GRAVITY, URINE: 1.019 (ref 1.005–1.030)
pH: 7.5 (ref 5.0–8.0)

## 2015-06-13 LAB — I-STAT CHEM 8, ED
BUN: 10 mg/dL (ref 6–20)
CALCIUM ION: 1.17 mmol/L (ref 1.12–1.23)
Chloride: 104 mmol/L (ref 101–111)
Creatinine, Ser: 0.7 mg/dL (ref 0.44–1.00)
GLUCOSE: 102 mg/dL — AB (ref 65–99)
HCT: 38 % (ref 36.0–46.0)
Hemoglobin: 12.9 g/dL (ref 12.0–15.0)
Potassium: 3.4 mmol/L — ABNORMAL LOW (ref 3.5–5.1)
SODIUM: 142 mmol/L (ref 135–145)
TCO2: 24 mmol/L (ref 0–100)

## 2015-06-13 LAB — CBC WITH DIFFERENTIAL/PLATELET
BASOS ABS: 0 10*3/uL (ref 0.0–0.1)
BASOS PCT: 0 %
EOS ABS: 0 10*3/uL (ref 0.0–0.7)
EOS PCT: 0 %
HCT: 34.9 % — ABNORMAL LOW (ref 36.0–46.0)
Hemoglobin: 10.9 g/dL — ABNORMAL LOW (ref 12.0–15.0)
Lymphocytes Relative: 25 %
Lymphs Abs: 2.3 10*3/uL (ref 0.7–4.0)
MCH: 24.1 pg — AB (ref 26.0–34.0)
MCHC: 31.2 g/dL (ref 30.0–36.0)
MCV: 77 fL — ABNORMAL LOW (ref 78.0–100.0)
Monocytes Absolute: 1 10*3/uL (ref 0.1–1.0)
Monocytes Relative: 11 %
Neutro Abs: 5.7 10*3/uL (ref 1.7–7.7)
Neutrophils Relative %: 64 %
PLATELETS: 271 10*3/uL (ref 150–400)
RBC: 4.53 MIL/uL (ref 3.87–5.11)
RDW: 15.2 % (ref 11.5–15.5)
WBC: 8.9 10*3/uL (ref 4.0–10.5)

## 2015-06-13 LAB — I-STAT BETA HCG BLOOD, ED (MC, WL, AP ONLY): I-stat hCG, quantitative: <5 m[IU]/mL (ref <5)

## 2015-06-13 MED ORDER — ACETAMINOPHEN 325 MG PO TABS: 650.0000 mg | ORAL_TABLET | Freq: Once | ORAL | Status: DC

## 2015-06-13 NOTE — ED Notes (Signed)
Pt is in stable condition upon d/c and ambulates from ED. 

## 2015-06-13 NOTE — Discharge Instructions (Signed)
Continue the present or Tylenol for pain. Try some heating pads to the left chest wall for possible musculoskeletal pain. Try stretching. Follow-up with your doctor. Your ultrasound and blood work did not show any serious abnormalities today.   Sensibilidad en las mamas (Breast Tenderness) La sensibilidad en las mamas es un problema frecuente en las mujeres de todas las edades. y puede causar molestias leves o dolor intenso. Sus causas son variadas. Su mdico determinar la causa probable de la sensibilidad Education administratormediante el examen de las [place], las preguntas United Stationerssobre los sntomas y la indicacin de algunos estudios. Por lo general, la sensibilidad en las mamas no significa que tenga cncer de mama. INSTRUCCIONES PARA EL CUIDADO EN EL HOGAR  A menudo, la sensibilidad en las mamas puede tratarse en Advice workerel hogar. Puede intentar lo siguiente:  Probarse un nuevo sostn que le brinde ms sujecin, especialmente mientras hace actividad fsica.  Usar un sostn con mejor sujecin o uno deportivo mientras duerme cuando las mamas estn muy sensibles.  Si tiene una lesin mamaria, aplique hielo en la zona:  Ponga el hielo en una bolsa plstica.  Colquese una toalla entre la piel y la bolsa de hielo.  Deje el hielo durante 20 minutos y aplquelo 2 a 3 veces por da.  Si tiene las Energy East Corporationmamas repletas de [place] debido a la Market researcherlactancia, intente lo siguiente:  Extrigase United Stationersleche manualmente o con un sacaleche.  Aplquese una compresa tibia en las mamas para ayudar a Manufacturing systems engineerla descarga.  Tome analgsicos de venta libre si su mdico lo autoriza.  Tome otros medicamentos que su mdico le recete, entre ellos, antibiticos o anticonceptivos. A largo plazo, puede aliviar la sensibilidad en las mamas si hace lo siguiente:  Disminuye el consumo de cafena.  Disminuye la cantidad de grasa de la dieta. Lleva un registro de [address] das y las horas cuando tiene mayor sensibilidad en las [place]. Esto ser de ayuda para que usted y su mdico  encuentren la causa de la sensibilidad y cmo Runner, broadcasting/film/videoaliviarla. Adems, aprenda cmo examinarse las mamas en casa. Esto la ayudar a palpar un crecimiento o un bulto fuera de lo normal que podra causar la sensibilidad. SOLICITE ATENCIN MDICA SI:   Cualquier zona de la mama est dura, enrojecida y caliente al tacto. Puede ser un signo de infeccin.  Hay secrecin de los pezones (y no est amamantando). En especial, vigile la secrecin de sangre o pus.  Tiene fiebre, adems de sensibilidad en las mamas.  Tiene un bulto nuevo o doloroso en la mama que no desaparece despus de la finalizacin del perodo menstrual.  Ha intentando controlar el dolor en casa, pero no desaparece.  El dolor de la mama es ms intenso o le dificulta hacer las cosas que hace habitualmente durante el da.   Esta informacin no tiene Theme park managercomo fin reemplazar el consejo del mdico. Asegrese de hacerle al mdico cualquier pregunta que tenga.   Document Released: 10/21/2012 Elsevier Interactive Patient Education [employer]2016 Elsevier Inc.

## 2015-06-13 NOTE — ED Notes (Signed)
Pt sts pain in left breast in neck and enlarged lymph node under chin; pt sts fever at night

## 2015-06-13 NOTE — ED Provider Notes (Signed)
CSN: [id number]     Arrival date & time 06/13/15  1755 History  By signing my name below, I, Debra Blackburn, attest that this documentation has been prepared under the direction and in the presence of Khaleah Duer, PA-C. Electronically Signed: Bridgette HabermannMaria Blackburn, ED Scribe. 06/13/2015. 7:52 PM.  Chief Complaint  Patient presents with  . Breast Pain    The history is provided by the patient. The history is limited by a language barrier. A language interpreter was used.    HPI Comments: Debra Blackburn is a 30 y.o. female who presents to the Emergency Department complaining of constant  pain on her left breast radiating up to her arm and to her back onset three months ago. Patient also has associated fever (TMAX 102 at home), generalized body aches, chills, dysuria, persistent cough, and blocked ears, and swollen submandibular lymph nodes. She also notes that her left nipple looks different from her right. Patient also states she is worried about the lump under her throat. Patient has a mammogram appointment on 06/19/15. Patient has taken Naproxen and Tylenol with no relief. Patient denies chance of pregnancy and is not currently breast feeding. Patient also denies nausea, vomiting, abdominal pain, and nipple discharge.    Past Medical History  Diagnosis Date  . Acid reflux    Past Surgical History  Procedure Laterality Date  . Cesarean section      x3  . Cholecystectomy N/A 07/16/2014    Procedure: LAPAROSCOPIC CHOLECYSTECTOMY WITH INTRAOPERATIVE CHOLANGIOGRAM;  Surgeon: Manus RuddMatthew Tsuei, MD;  Location: [facility] OR;  Service: General;  Laterality: N/A;  . Endoscopic retrograde cholangiopancreatography (ercp) with propofol N/A 07/15/2014    Procedure: ENDOSCOPIC RETROGRADE CHOLANGIOPANCREATOGRAPHY (ERCP) WITH PROPOFOL;  Surgeon: Vida RiggerMarc Magod, MD;  [facility];  Service: Endoscopy;  Laterality: N/A;   Family History  Problem Relation Age of Onset  . Hypertension Mother   . Diabetes Father     Social History  Substance Use Topics  . Smoking status: Never Smoker   . Smokeless tobacco: None  . Alcohol Use: No   OB History    No data available     Review of Systems  Constitutional: Positive for fever and chills.  HENT: Positive for ear pain.   Respiratory: Positive for cough.   Gastrointestinal: Negative for nausea, abdominal pain, diarrhea and constipation.  Genitourinary: Positive for dysuria.  Musculoskeletal: Positive for back pain and arthralgias (generalized).      Allergies  Review of patient's allergies indicates no known allergies.  Home Medications   Prior to Admission medications   Medication Sig Start Date End Date Taking? Authorizing Provider  HYDROcodone-acetaminophen (NORCO/VICODIN) 5-325 MG tablet Take 1 tablet by mouth every 8 (eight) hours as needed for moderate pain or severe pain. 06/05/15   Hannah Muthersbaugh, PA-C   BP 141/81 mmHg  Pulse 110  Temp(Src) 98.4 F (36.9 C) (Oral)  Resp 18  SpO2 100%  LMP 05/20/2015 Physical Exam  Constitutional: She is oriented to person, place, and time. She appears well-developed and well-nourished. No distress.  HENT:  Head: Normocephalic.  Right Ear: External ear normal.  Left Ear: External ear normal.  Nose: Nose normal.  Mouth/Throat: Oropharynx is clear and moist.  TMs are normal bilaterally. Tonsils are normal. Uvula is midline. No exudate.  Eyes: Conjunctivae are normal.  Neck: Neck supple.  Cardiovascular: Normal rate, regular rhythm and normal heart sounds.   Pulmonary/Chest: Effort normal and breath sounds normal. No respiratory distress. She has no wheezes. She has no rales.  Left nipple sits slightly higher compared to the right. No obvious breast deformity or breast difference noted.  Lymphadenopathy:    She has no cervical adenopathy.  Neurological: She is alert and oriented to person, place, and time.  Skin: Skin is warm and dry.  Nursing note and vitals reviewed.   ED Course   Procedures (including critical care time) DIAGNOSTIC STUDIES: Oxygen Saturation is 100% on RA, normal by my interpretation.    COORDINATION OF CARE: 7:46 PM Discussed treatment plan with pt at bedside which includes an ultrasound, blood test, and urinalysis and pt agreed to plan.   Labs Review Labs Reviewed  CBC WITH DIFFERENTIAL/PLATELET - Abnormal; Notable for the following:    Hemoglobin 10.9 (*)    HCT 34.9 (*)    MCV 77.0 (*)    MCH 24.1 (*)    All other components within normal limits  I-STAT CHEM 8, ED - Abnormal; Notable for the following:    Potassium 3.4 (*)    Glucose, Bld 102 (*)    All other components within normal limits  URINALYSIS, ROUTINE W REFLEX MICROSCOPIC (NOT AT [facility])  I-STAT BETA HCG BLOOD, ED ([facility], [facility], [facility] ONLY)    Imaging Review Dg Chest 2 View  06/13/2015  CLINICAL DATA:  Left-sided chest pain for 3 months EXAM: CHEST  2 VIEW COMPARISON:  None. FINDINGS: Lungs are clear. Heart size and pulmonary vascularity are normal. No adenopathy. No pneumothorax. No bone lesions. IMPRESSION: No edema or consolidation. Electronically Signed   By: Bretta Bang III M.D.   On: 06/13/2015 19:39   US Breast Ltd Uni Left Inc Axilla  06/13/2015  CLINICAL DATA:  Chronic left breast pain at the upper outer quadrant, near the axilla. Initial encounter. EXAM: ULTRASOUND OF THE LEFT BREAST COMPARISON:  None. FINDINGS: On physical exam, no focal abnormality is characterized. Targeted ultrasound is performed, showing a 6 mm lymph node at the site of the patient's pain, at the 2 o'clock region. No focal soft tissue abnormalities are seen in this region. IMPRESSION: No acute abnormality seen at the site of patient's pain. 0.6 cm node noted at the 2 o'clock position of the left breast, within normal limits. BI-RADS CATEGORY  2: Benign Finding(s) Electronically Signed   By: Roanna Raider M.D.   On: 06/13/2015 22:14   I have personally reviewed and evaluated these images and lab  results as part of my medical decision-making.   EKG Interpretation None      MDM   Final diagnoses:  Breast pain  Breast pain, left  Dysuria  Sore throat   Patient emergency department with multiple complaints, she is complaining of bilateral ear pain, sore throat, swollen lymph nodes in her neck, dysuria, left breast pain radiating into the axilla. Patient was actually just seen in emergency department one week ago for abdominal pain. Had labs, pelvic exam, urinalysis done, had CT scan abdomen and pelvis is done all unremarkable. Although she states that her breast pain has been there for several weeks, it was not mentioned on her visit a week ago. Patient however has had no 5 visits to emergency department for complaint of fever, nonspecific abdominal issues including dysuria, and left breast pain. She actually mentioned left breast pain 2 months ago when she was seen here in emergency department and she has an appointment scheduled with the breast center for ultrasound and 5 days from today. Patient has also had 2 visits to [facility] with the same complaints in the  last month as well. Patient is very concerned, states she has had fever up to 102 at home. I do not feel any masses or obvious signs of infection in her breast, will get ultrasound for further evaluation. Labs ordered. Urinalysis ordered.   10:35 PM Patient's ultrasound is negative. I question whether she may have some muscular pain in her left chest wall. Her dysuria symptoms to be chronic and she now is telling me that she actually scheduled to see a urologist. Her oropharynx exam does not show any emergent conditions. Will recheck vital signs, earlier was slightly tachycardic, will have a follow-up with her family doctor and urology as she is already scheduled and has appointments to follow-up.  Filed Vitals:   06/13/15 1800  BP: 141/81  Pulse: 110  Temp: 98.4 F (36.9 C)  Resp: [address],  PA-C 06/13/15 2236  Tilden Fossa, MD 06/14/15 [phone]

## 2015-08-07 ENCOUNTER — Emergency Department (HOSPITAL_COMMUNITY)
Admission: EM | Admit: 2015-08-07 | Discharge: 2015-08-07 | Disposition: A | Discharge status: Home / Self Care | Payer: Medicaid Other | Attending: Dermatology | Admitting: Dermatology

## 2015-08-07 DIAGNOSIS — Z5321 Procedure and treatment not carried out due to patient leaving prior to being seen by health care provider: Secondary | ICD-10-CM | POA: Insufficient documentation

## 2015-08-07 DIAGNOSIS — M546 Pain in thoracic spine: Secondary | ICD-10-CM | POA: Diagnosis present

## 2015-08-07 NOTE — ED Triage Notes (Signed)
Pt states that she has had R upper back pain x 3 months that has been treated with muscle relaxers and naprosyn but she states it comes back after she finishes the medication and goes back to work. Alert and oriented.

## 2015-08-07 NOTE — ED Notes (Signed)
No answer 1 and 2

## 2015-08-07 NOTE — ED Triage Notes (Signed)
Pt not in lobby when called

## 2015-09-19 ENCOUNTER — Encounter (HOSPITAL_COMMUNITY): Payer: Self-pay | Admitting: *Deleted

## 2015-09-19 ENCOUNTER — Emergency Department (HOSPITAL_COMMUNITY)
Admission: EM | Admit: 2015-09-19 | Discharge: 2015-09-19 | Disposition: A | Discharge status: Home / Self Care | Payer: Medicaid Other | Attending: Emergency Medicine | Admitting: Emergency Medicine

## 2015-09-19 DIAGNOSIS — R109 Unspecified abdominal pain: Secondary | ICD-10-CM

## 2015-09-19 DIAGNOSIS — R1010 Upper abdominal pain, unspecified: Secondary | ICD-10-CM | POA: Insufficient documentation

## 2015-09-19 LAB — CBC
HEMATOCRIT: 38.3 % (ref 36.0–46.0)
Hemoglobin: 12 g/dL (ref 12.0–15.0)
MCH: 25 pg — AB (ref 26.0–34.0)
MCHC: 31.3 g/dL (ref 30.0–36.0)
MCV: 79.8 fL (ref 78.0–100.0)
PLATELETS: 281 10*3/uL (ref 150–400)
RBC: 4.8 MIL/uL (ref 3.87–5.11)
RDW: 14.3 % (ref 11.5–15.5)
WBC: 6.6 10*3/uL (ref 4.0–10.5)

## 2015-09-19 LAB — URINALYSIS, ROUTINE W REFLEX MICROSCOPIC
BILIRUBIN URINE: NEGATIVE
GLUCOSE, UA: NEGATIVE mg/dL
HGB URINE DIPSTICK: NEGATIVE
KETONES UR: NEGATIVE mg/dL
Leukocytes, UA: NEGATIVE
NITRITE: NEGATIVE
PH: 5.5 (ref 5.0–8.0)
Protein, ur: NEGATIVE mg/dL
SPECIFIC GRAVITY, URINE: 1.024 (ref 1.005–1.030)

## 2015-09-19 LAB — LIPASE, BLOOD: LIPASE: 21 U/L (ref 11–51)

## 2015-09-19 LAB — COMPREHENSIVE METABOLIC PANEL
ALT: 15 U/L (ref 14–54)
AST: 23 U/L (ref 15–41)
Albumin: 4.3 g/dL (ref 3.5–5.0)
Alkaline Phosphatase: 65 U/L (ref 38–126)
Anion gap: 7 (ref 5–15)
BILIRUBIN TOTAL: 0.4 mg/dL (ref 0.3–1.2)
BUN: 7 mg/dL (ref 6–20)
CHLORIDE: 104 mmol/L (ref 101–111)
CO2: 27 mmol/L (ref 22–32)
Calcium: 10 mg/dL (ref 8.9–10.3)
Creatinine, Ser: 0.62 mg/dL (ref 0.44–1.00)
Glucose, Bld: 96 mg/dL (ref 65–99)
POTASSIUM: 4.1 mmol/L (ref 3.5–5.1)
Sodium: 138 mmol/L (ref 135–145)
Total Protein: 8.5 g/dL — ABNORMAL HIGH (ref 6.5–8.1)

## 2015-09-19 LAB — PREGNANCY, URINE: Preg Test, Ur: NEGATIVE

## 2015-09-19 MED ORDER — IBUPROFEN 200 MG PO TABS
400.0000 mg | ORAL_TABLET | Freq: Once | ORAL | Status: AC
  Administered 2015-09-19: 400 mg via ORAL
  Filled 2015-09-19: qty 2

## 2015-09-19 NOTE — Discharge Instructions (Signed)
It was our pleasure to provide your ER care today - we hope that you feel better.  Rest. Drink plenty of fluids.  Take motrin or aleve as need for pain.  Follow up with primary care doctor in the next 1-2 weeks.  Return to ER if worse, new symptoms, fevers, persistent vomiting, or other medical emergency.

## 2015-09-19 NOTE — ED Provider Notes (Signed)
[facility]-EMER[facility]Y DEPT Provider Note   CSN: [id number] Arrival date & time: 09/19/15  0818     History   Chief Complaint Chief Complaint  Patient presents with  . Flank Pain    HPI Debra Blackburn is a 30 y.o. female.  Patient c/o right flank pain laterally, and occasional upper abd pain for the past 2 months. Symptoms persistent, non radiating, dull, moderate. No specific exacerbating or alleviating factors. No acute or abrupt change today. Occasional nausea. No vomiting. Having normal bms. No lower abd or pelvic pain. No cp or sob. Denies dysuria or hematuria. lnmp 1 week ago. Denies fever or chills.      The history is provided by the patient.  Flank Pain  Pertinent negatives include no chest pain, no abdominal pain, no headaches and no shortness of breath.    Past Medical History:  Diagnosis Date  . Acid reflux     Patient Active Problem List   Diagnosis Date Noted  . Microcytic anemia 04/26/2015  . Acute cholangitis 07/17/2014  . Cholecystitis with cholelithiasis 07/17/2014  . Choledocholithiasis 07/14/2014    Past Surgical History:  Procedure Laterality Date  . CESAREAN SECTION     x3  . CHOLECYSTECTOMY N/A 07/16/2014   Procedure: LAPAROSCOPIC CHOLECYSTECTOMY WITH INTRAOPERATIVE CHOLANGIOGRAM;  Surgeon: Manus Rudd, MD;  Location: [facility] OR;  Service: General;  Laterality: N/A;  . ENDOSCOPIC RETROGRADE CHOLANGIOPANCREATOGRAPHY (ERCP) WITH PROPOFOL N/A 07/15/2014   Procedure: ENDOSCOPIC RETROGRADE CHOLANGIOPANCREATOGRAPHY (ERCP) WITH PROPOFOL;  Surgeon: Vida Rigger, MD;  Location[facility] ENDOSCOPY;  Service: Endoscopy;  Laterality: N/A;    OB History    No data available       Home Medications    Prior to Admission medications   Medication Sig Start Date End Date Taking? Authorizing Provider  HYDROcodone-acetaminophen (NORCO/VICODIN) 5-325 MG tablet Take 1 tablet by mouth every 8 (eight) hours as needed for moderate pain or severe pain. Patient not taking:  Reported on 06/13/2015 06/05/15   Dahlia Client Muthersbaugh, PA-C    Family History Family History  Problem Relation Age of Onset  . Hypertension Mother   . Diabetes Father     Social History Social History  Substance Use Topics  . Smoking status: Never Smoker  . Smokeless tobacco: Not on file  . Alcohol use No     Allergies   Review of patient's allergies indicates no known allergies.   Review of Systems Review of Systems  Constitutional: Negative for chills and fever.  HENT: Negative for sore throat.   Eyes: Negative for redness.  Respiratory: Negative for cough and shortness of breath.   Cardiovascular: Negative for chest pain.  Gastrointestinal: Negative for abdominal pain, diarrhea and vomiting.  Genitourinary: Positive for flank pain. Negative for dysuria, vaginal bleeding and vaginal discharge.  Musculoskeletal: Negative for back pain and neck pain.  Skin: Negative for rash.  Neurological: Negative for headaches.  Hematological: Does not bruise/bleed easily.  Psychiatric/Behavioral: Negative for confusion.     Physical Exam Updated Vital Signs BP 114/82   Pulse 93   Temp 98 F (36.7 C) (Oral)   Resp 18   Ht 5\' 3"  (1.6 m)   Wt 68 kg   LMP 09/10/2015   SpO2 100%   BMI 26.57 kg/m   Physical Exam  Constitutional: She appears well-developed and well-nourished. No distress.  HENT:  Mouth/Throat: Oropharynx is clear and moist.  Eyes: Conjunctivae are normal. No scleral icterus.  Neck: Neck supple. No tracheal deviation present.  Cardiovascular: Normal rate, regular rhythm,  normal heart sounds and intact distal pulses.  Exam reveals no gallop and no friction rub.   No murmur heard. Pulmonary/Chest: Effort normal and breath sounds normal. No respiratory distress.  Abdominal: Soft. Normal appearance and bowel sounds are normal. She exhibits no distension and no mass. There is no tenderness. There is no rebound and no guarding. No hernia.  Genitourinary:    Genitourinary Comments: No cva tenderness  Musculoskeletal: She exhibits no edema.  TL spine non tender.   Neurological: She is alert.  Skin: Skin is warm and dry. She is not diaphoretic.  Psychiatric: She has a normal mood and affect.  Nursing note and vitals reviewed.    ED Treatments / Results  Labs (all labs ordered are listed, but only abnormal results are displayed) Results for orders placed or performed during the hospital encounter of 09/19/15  Urinalysis, Routine w reflex microscopic- may I&O cath if menses  Result Value Ref Range   Color, Urine YELLOW YELLOW   APPearance CLEAR CLEAR   Specific Gravity, Urine 1.024 1.005 - 1.030   pH 5.5 5.0 - 8.0   Glucose, UA NEGATIVE NEGATIVE mg/dL   Hgb urine dipstick NEGATIVE NEGATIVE   Bilirubin Urine NEGATIVE NEGATIVE   Ketones, ur NEGATIVE NEGATIVE mg/dL   Protein, ur NEGATIVE NEGATIVE mg/dL   Nitrite NEGATIVE NEGATIVE   Leukocytes, UA NEGATIVE NEGATIVE  Pregnancy, urine  Result Value Ref Range   Preg Test, Ur NEGATIVE NEGATIVE  CBC  Result Value Ref Range   WBC 6.6 4.0 - 10.5 K/uL   RBC 4.80 3.87 - 5.11 MIL/uL   Hemoglobin 12.0 12.0 - 15.0 g/dL   HCT 91.4 78.2 - 95.6 %   MCV 79.8 78.0 - 100.0 fL   MCH 25.0 (L) 26.0 - 34.0 pg   MCHC 31.3 30.0 - 36.0 g/dL   RDW 21.3 08.6 - 57.8 %   Platelets 281 150 - 400 K/uL  Comprehensive metabolic panel  Result Value Ref Range   Sodium 138 135 - 145 mmol/L   Potassium 4.1 3.5 - 5.1 mmol/L   Chloride 104 101 - 111 mmol/L   CO2 27 22 - 32 mmol/L   Glucose, Bld 96 65 - 99 mg/dL   BUN 7 6 - 20 mg/dL   Creatinine, Ser 4.69 0.44 - 1.00 mg/dL   Calcium 62.9 8.9 - 52.8 mg/dL   Total Protein 8.5 (H) 6.5 - 8.1 g/dL   Albumin 4.3 3.5 - 5.0 g/dL   AST 23 15 - 41 U/L   ALT 15 14 - 54 U/L   Alkaline Phosphatase 65 38 - 126 U/L   Total Bilirubin 0.4 0.3 - 1.2 mg/dL   GFR calc non Af Amer >60 >60 mL/min   GFR calc Af Amer >60 >60 mL/min   Anion gap 7 5 - 15  Lipase, blood  Result  Value Ref Range   Lipase 21 11 - 51 U/L   EKG  EKG Interpretation None       Radiology No results found.  Procedures Procedures (including critical care time)  Medications Ordered in ED Medications - No data to display   Initial Impression / Assessment and Plan / ED Course  I have reviewed the triage vital signs and the nursing notes.  Pertinent labs & imaging results that were available during my care of the patient were reviewed by me and considered in my medical decision making (see chart for details).  Clinical Course   Labs. Motrin po.  Pain appears exacerbating  by certain movements, turning - ?musculoskeletal.   Reviewed nursing notes and prior charts for additional history.   Patient with CT a few months ago for similar right sided symptoms, neg for acute process then.   Recheck pt comfortable appearing, afeb. Benign abd exam. Labs unremarkable.  rec pcp f/u as outpatient.     Final Clinical Impressions(s) / ED Diagnoses   Final diagnoses:  None    New Prescriptions New Prescriptions   No medications on file     Cathren LaineKevin Tuff Clabo, MD 09/19/15 1042

## 2015-09-19 NOTE — ED Triage Notes (Signed)
Pt reports rt flanbk pain that has been intermittent for 2 months. Pt reports being seen for same. Reports n/v as well. Pt also has burning with urination.

## 2015-09-22 ENCOUNTER — Other Ambulatory Visit: Payer: Self-pay | Admitting: General Surgery

## 2015-10-09 ENCOUNTER — Ambulatory Visit: Payer: Medicaid Other | Admitting: Obstetrics and Gynecology

## 2017-08-30 IMAGING — US US BREAST*L* LIMITED INC AXILLA
1 series · 14 of 16 positions shown · non-contrast
Comparison: None.

CLINICAL DATA: Chronic left breast pain at the upper outer
quadrant, near the axilla. Initial encounter.

EXAM:
ULTRASOUND OF THE LEFT BREAST

[Series 1: us breast*left* limited inc axilla · 0.06mm/px · 14 of 16 slices shown]
[im 1/16]
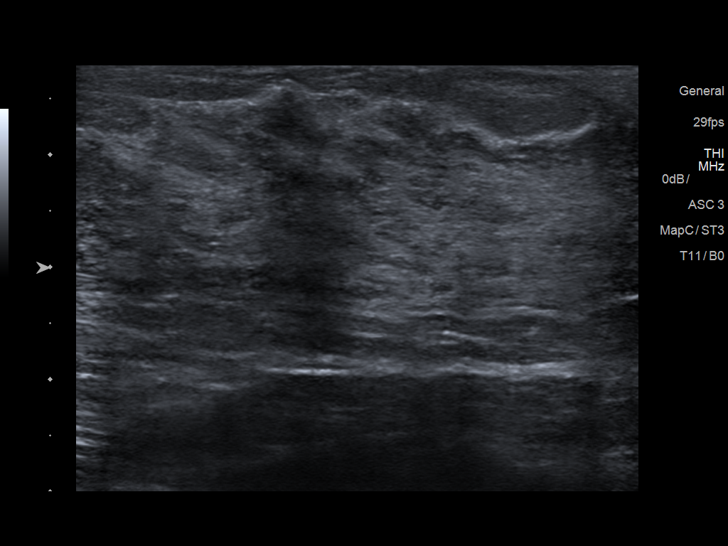
[im 2/16]
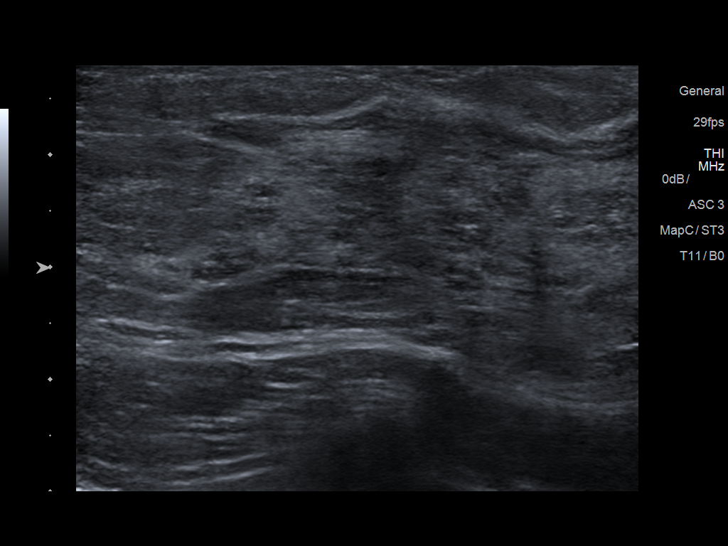
[im 3/16]
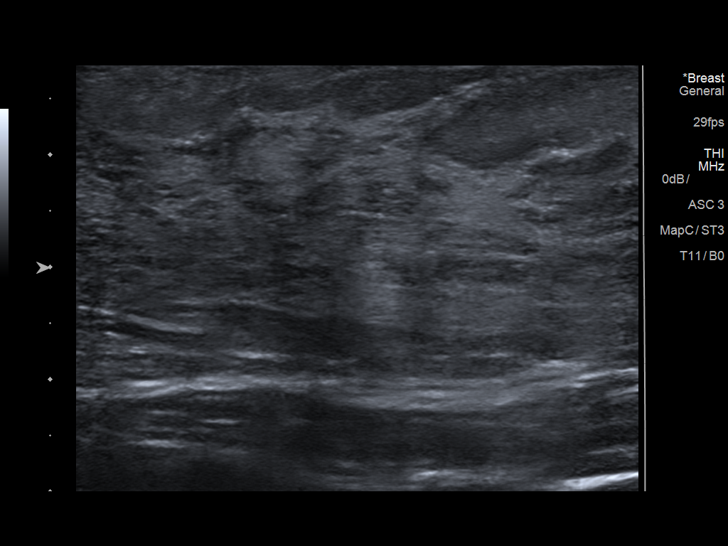
[im 5/16]
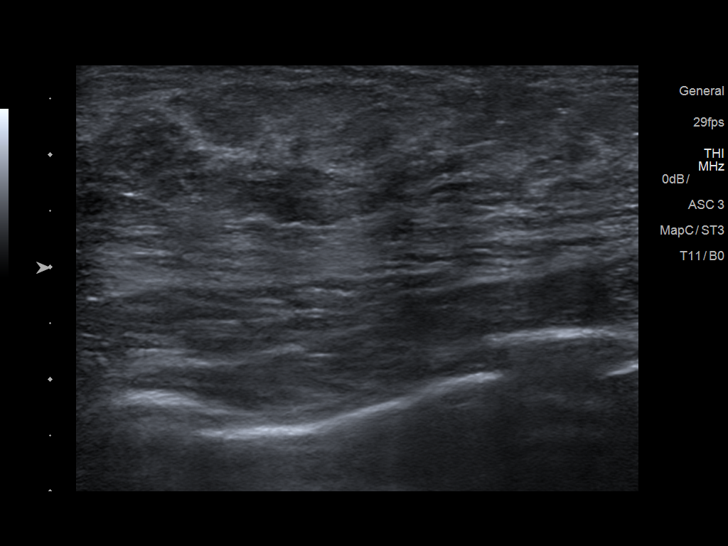
[im 6/16]
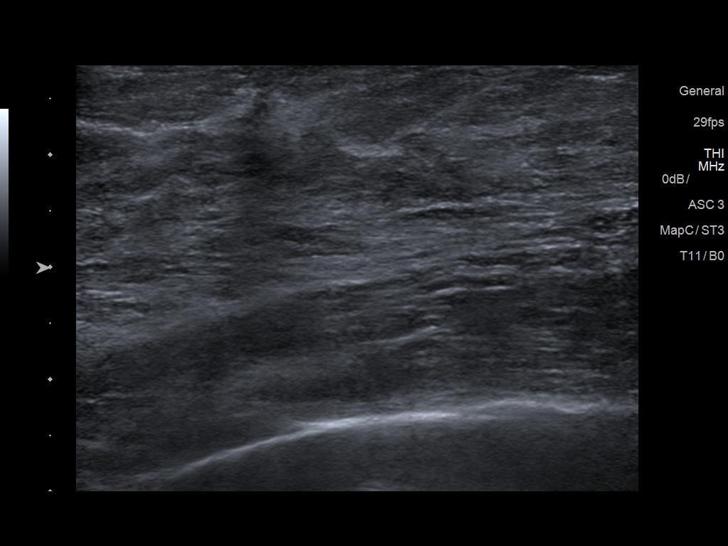
[im 7/16]
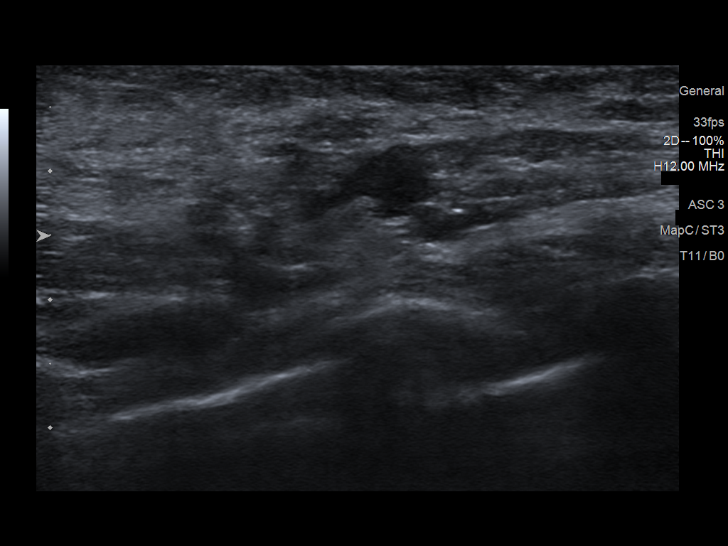
[im 8/16]
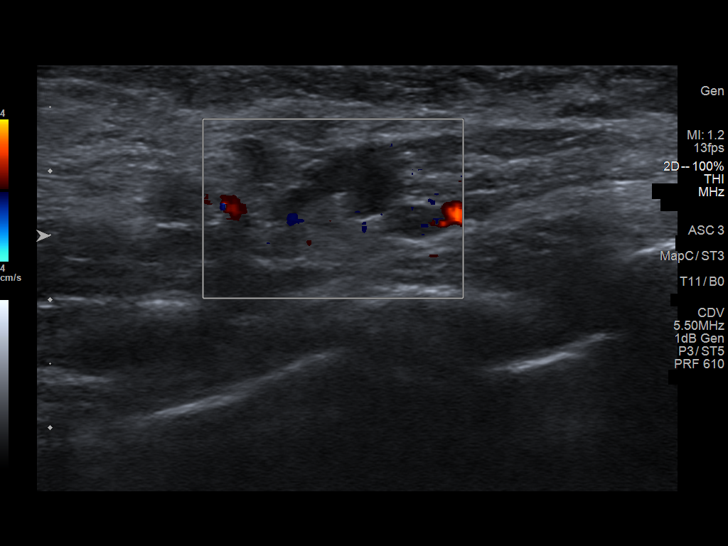
[im 9/16]
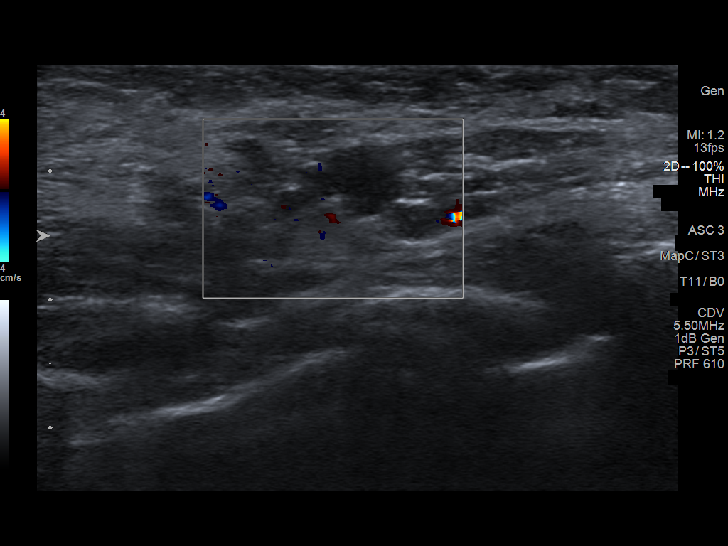
[im 10/16]
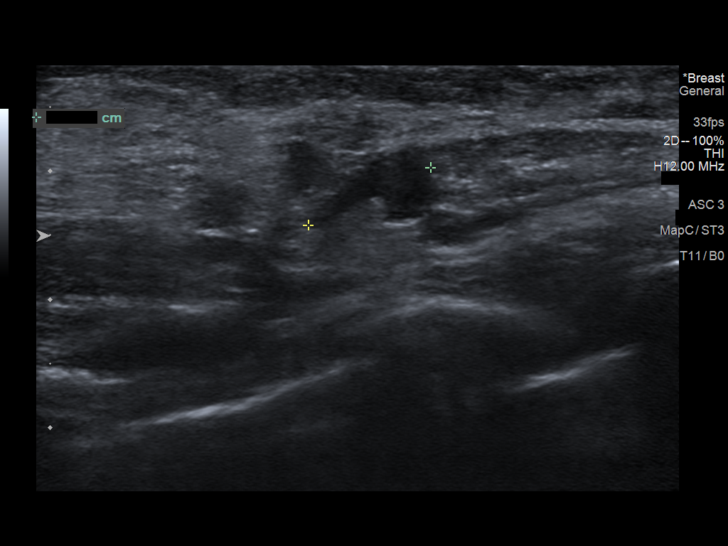
[im 11/16]
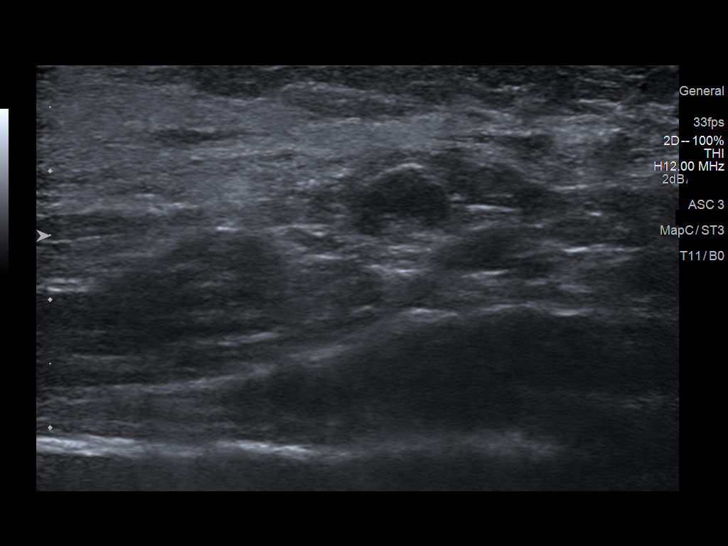
[im 13/16]
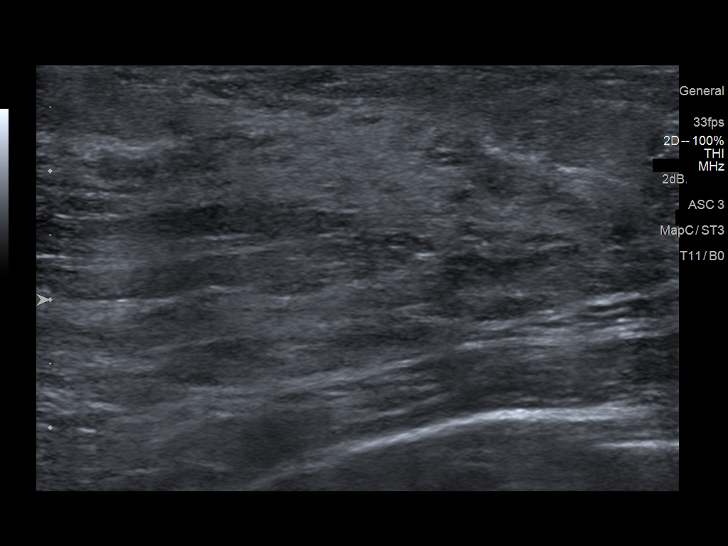
[im 14/16]
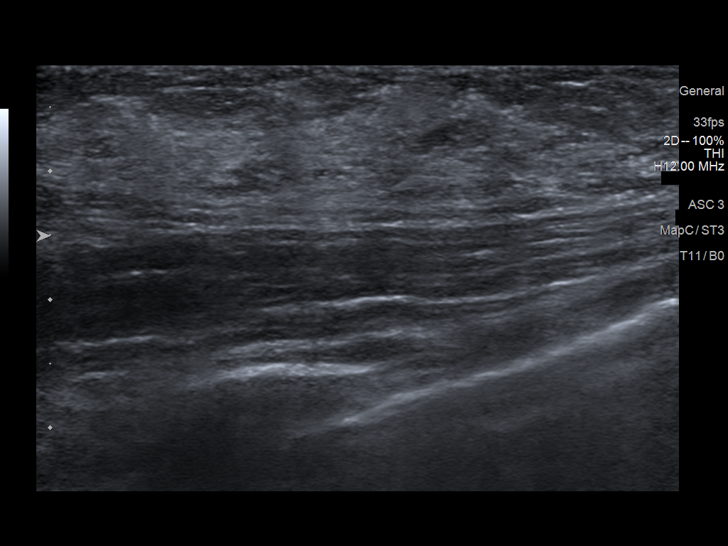
[im 15/16]
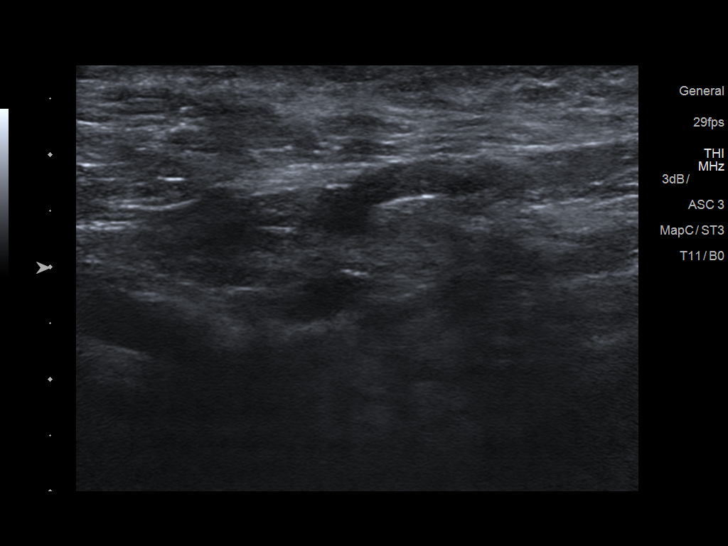
[im 16/16]
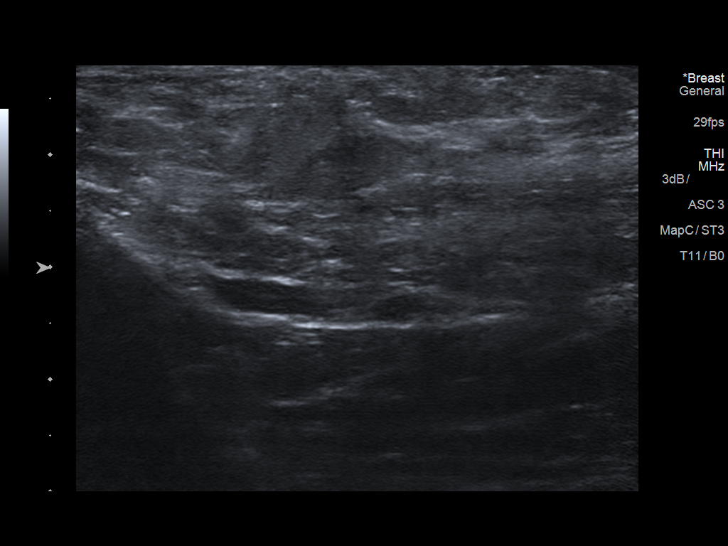

[14 of 16 positions shown; findings below may reference images not displayed]

FINDINGS: On physical exam, no focal abnormality is characterized.

Targeted ultrasound is performed, showing a 6 mm lymph node at the
site of the patient's pain, at the 2 o'clock region. No focal soft
tissue abnormalities are seen in this region.
IMPRESSION: No acute abnormality seen at the site of patient's pain. 0.6 cm node
noted at the 2 o'clock position of the left breast, within normal
limits.

BI-RADS CATEGORY  2: Benign Finding(s)
